# Patient Record
Sex: Male | Born: 1990 | Race: White | Hispanic: No | Marital: Single | Smoking: Current every day smoker
Health system: Southern US, Community
[De-identification: ages and names within clinical notes are randomized; demographics above are authoritative.]

## PROBLEM LIST (undated history)

## (undated) DIAGNOSIS — M109 Gout, unspecified: Secondary | ICD-10-CM

## (undated) HISTORY — PX: TOE SURGERY: SHX1073

---
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
## 2011-01-19 LAB — RAPID STREP SCREEN (MED CTR MEBANE ONLY): Streptococcus, Group A Screen (Direct): POSITIVE — AB

## 2011-10-09 ENCOUNTER — Encounter (HOSPITAL_BASED_OUTPATIENT_CLINIC_OR_DEPARTMENT_OTHER): Payer: Self-pay

## 2011-10-09 ENCOUNTER — Emergency Department (HOSPITAL_BASED_OUTPATIENT_CLINIC_OR_DEPARTMENT_OTHER): Payer: Self-pay

## 2011-10-09 ENCOUNTER — Emergency Department (HOSPITAL_BASED_OUTPATIENT_CLINIC_OR_DEPARTMENT_OTHER)
Admission: EM | Admit: 2011-10-09 | Discharge: 2011-10-09 | Disposition: A | Discharge status: Home / Self Care | Payer: Self-pay | Attending: Emergency Medicine | Admitting: Emergency Medicine

## 2011-10-09 DIAGNOSIS — W2209XA Striking against other stationary object, initial encounter: Secondary | ICD-10-CM | POA: Insufficient documentation

## 2011-10-09 DIAGNOSIS — S60221A Contusion of right hand, initial encounter: Secondary | ICD-10-CM

## 2011-10-09 DIAGNOSIS — S6990XA Unspecified injury of unspecified wrist, hand and finger(s), initial encounter: Secondary | ICD-10-CM | POA: Insufficient documentation

## 2011-10-09 MED ORDER — HYDROCODONE-ACETAMINOPHEN 5-325 MG PO TABS
1.0000 | ORAL_TABLET | Freq: Once | ORAL | Status: AC
  Administered 2011-10-09: 1 via ORAL
  Filled 2011-10-09: qty 1

## 2011-10-09 MED ORDER — HYDROCODONE-ACETAMINOPHEN 5-325 MG PO TABS: 1.0000 | ORAL_TABLET | Freq: Four times a day (QID) | ORAL | Status: AC | PRN

## 2011-10-09 NOTE — ED Notes (Signed)
Patient reports that he hit right hand yesterday on hood of car after hand slipped off wrench. Minimal swelling noted, no abrasion or bruising noted. Pain with movement of 4th and 5th digit

## 2011-10-09 NOTE — Discharge Instructions (Signed)
Contusion (Bruise) of Hand An injury to the hand may cause bruises (contusions). Contusions are caused by bleeding from small blood vessels (capillaries) that allow blood to leak out into the muscles, tendons, and surrounding soft tissue. This is followed by swelling and pain (inflammation). Contusions of the hand are common because of the use of hands in daily and recreational activities. Signs of a hand injury include pain, swelling, and a color change. Initially the skin may turn blue to purple in color. As the bruise ages, the color turns yellow and orange. Swelling may decrease the movement of the fingers. Contusions are seen more commonly with:  Contact sports (especially in football, wrestling, and basketball).   Use of medications that thin the blood (anticoagulants).   Use of aspirin and nonsteroidal anti-inflammatory agents that decrease the ability of the blood to clot.   Vitamin deficiencies.   Aging.  DIAGNOSIS  Diagnosis of hand injuries can be made by your own observation. If problems continue, a caregiver may be required for further evaluation and treatment. X-rays may be required to make sure there are no broken bones (fractures). Continued problems may require physical therapy for treatment. HOME CARE INSTRUCTIONS   Apply ice to the injury for 15 to 20 minutes, 3 to 4 times per day. Put the ice in a plastic bag and place a towel between the bag of ice and your skin.   An elastic bandage may be used initially for support and to minimize swelling. Do not wrap the hand too tightly. Do not sleep with the elastic bandage on.   Gentle massage from the fingertips towards the elbow will help keep the swelling down. Gently open and close your fist while doing this to maintain range of motion. Do this only after the first few days, when there is no or minimal pain.   Keep your hand above the level of the heart when swelling and pain are present. This will allow the fluid to drain out of  the hand, decreasing the amount of swelling. This will improve healing time.   Try to avoid use of the injured hand (except for gentle range of motion) while the hand is hurting. Do not resume use until instructed by your caregiver. Then begin use gradually, do not increase use to the point of pain. If pain does develop, decrease use and continue the above measures, gradually increasing activities that do not cause discomfort until you achieve normal use.   Only take over-the-counter or prescription medicines for pain, discomfort, or fever as directed by your caregiver.   Follow up with your caregiver as directed. Follow-up care may include orthopedic referrals, physical therapy, and rehabilitation. Any delay in obtaining necessary care could result in delayed healing, or temporary or permanent disability.  REHABILITATION  Begin daily rehabilitation exercises when an elastic bandage is no longer needed and you are either pain free or only have minimal pain.   Use ice massage for 10 minutes before and after workouts. Put ice in a plastic bag and place a towel between the bag of ice and your skin. Massage the injured area with the ice pack.  SEEK IMMEDIATE MEDICAL CARE IF:   Your pain and swelling increase, or pain is uncontrolled with medications.   You have loss of feeling in your hand, or your hand turns cold or blue.   An oral temperature above 102 F (38.9 C) develops, not controlled by medication.   Your hand becomes warm to the touch, or you  have increased pain with even slight movement of your fingers.   Your hand does not begin to improve in 1 or 2 days.   The skin is broken and signs of infection occur (fluid draining from the contusion, increasing pain, fever, headache, muscle aches, dizziness, or a general ill feeling).   You develop new, unexplained problems, or an increase of the symptoms that brought you to your caregiver.  MAKE SURE YOU:   Understand these instructions.     Will watch your condition.   Will get help right away if you are not doing well or get worse.  Document Released: 09/24/2001 Document Revised: 03/24/2011 Document Reviewed: 09/11/2009 [facility] Patient Information 2012 [place], [place].

## 2011-10-09 NOTE — ED Provider Notes (Signed)
History     CSN: [id number]  Arrival date & time 2011/10/12  0201   First MD Initiated Contact with Patient 10-12-11 0234      Chief Complaint  Patient presents with  . Hand Injury    (Consider location/radiation/quality/duration/timing/severity/associated sxs/prior treatment) HPI Is a 21 year old white male who was tightening a bolt with a wrench yesterday afternoon and his wrench slipped off the bolt. His right hand struck the hood of the car. He is now having pain and swelling to the dorsum of the right hand. The pain is moderate in severity and worse with palpation or movement of his fingers. He denies wrist pain. There is no deformity.  History reviewed. No pertinent past medical history.  History reviewed. No pertinent past surgical history.  No family history on file.  History  Substance Use Topics  . Smoking status: Not on file  . Smokeless tobacco: Not on file  . Alcohol Use: No      Review of Systems  All other systems reviewed and are negative.    Allergies  Review of patient's allergies indicates no known allergies.  Home Medications  No current outpatient prescriptions on file.  BP 136/69  Pulse 83  Temp 98.1 F (36.7 C) (Oral)  Resp 16  SpO2 100%  Physical Exam General: Well-developed, well-nourished male in no acute distress; appearance consistent with age of record HENT: normocephalic, atraumatic Eyes: Normal appearance Neck: supple Heart: regular rate and rhythm Lungs: Normal respiratory effort and excursion Abdomen: soft; nondistended Extremities: No deformity; decreased range of motion of fingers of right hand due to pain and swelling; mild swelling and tenderness of dorsal right hand without ecchymosis or crepitus, fingers distally neurovascularly intact with intact tendon function, no snuff box tenderness Neurologic: Awake, alert and oriented; motor function intact in all extremities and symmetric; no facial droop Skin: Warm and  dry Psychiatric: Normal mood and affect    ED Course  Procedures (including critical care time)     MDM   Nursing notes and vitals signs, including pulse oximetry, reviewed.  Summary of this visit's results, reviewed by myself:   Imaging Studies: Dg Hand Complete Right  12-Oct-2011  *RADIOLOGY REPORT*  Clinical Data: Injury to right hand; minimal swelling about the right hand, with pain on motion.  RIGHT HAND - COMPLETE 3+ VIEW  Comparison: Right middle finger radiographs performed 02/27/2006  Findings: There is no evidence of fracture or dislocation.  The joint spaces are preserved; mild soft tissue swelling is noted about the fifth metacarpophalangeal joint.  The carpal rows are intact, and demonstrate normal alignment.  Mild negative ulnar variance is noted.  IMPRESSION: No evidence of fracture or dislocation.  Original Report Authenticated By: Tonia Ghent, M.D.            Hanley Seamen, MD October 12, 2011 [phone]

## 2012-04-02 ENCOUNTER — Emergency Department (HOSPITAL_BASED_OUTPATIENT_CLINIC_OR_DEPARTMENT_OTHER)
Admission: EM | Admit: 2012-04-02 | Discharge: 2012-04-02 | Disposition: A | Discharge status: Home / Self Care | Payer: Self-pay | Attending: Emergency Medicine | Admitting: Emergency Medicine

## 2012-04-02 ENCOUNTER — Emergency Department (HOSPITAL_BASED_OUTPATIENT_CLINIC_OR_DEPARTMENT_OTHER): Payer: Self-pay

## 2012-04-02 ENCOUNTER — Encounter (HOSPITAL_BASED_OUTPATIENT_CLINIC_OR_DEPARTMENT_OTHER): Payer: Self-pay | Admitting: *Deleted

## 2012-04-02 DIAGNOSIS — S60221A Contusion of right hand, initial encounter: Secondary | ICD-10-CM

## 2012-04-02 DIAGNOSIS — Y929 Unspecified place or not applicable: Secondary | ICD-10-CM | POA: Insufficient documentation

## 2012-04-02 DIAGNOSIS — S60229A Contusion of unspecified hand, initial encounter: Secondary | ICD-10-CM | POA: Insufficient documentation

## 2012-04-02 DIAGNOSIS — Y939 Activity, unspecified: Secondary | ICD-10-CM | POA: Insufficient documentation

## 2012-04-02 DIAGNOSIS — F17200 Nicotine dependence, unspecified, uncomplicated: Secondary | ICD-10-CM | POA: Insufficient documentation

## 2012-04-02 DIAGNOSIS — X58XXXA Exposure to other specified factors, initial encounter: Secondary | ICD-10-CM | POA: Insufficient documentation

## 2012-04-02 MED ORDER — IBUPROFEN 800 MG PO TABS: 800.0000 mg | ORAL_TABLET | Freq: Three times a day (TID) | ORAL | Status: DC

## 2012-04-02 MED ORDER — IBUPROFEN 800 MG PO TABS
ORAL_TABLET | ORAL | Status: AC
  Administered 2012-04-02: 800 mg via ORAL
  Filled 2012-04-02: qty 1

## 2012-04-02 MED ORDER — IBUPROFEN 800 MG PO TABS
800.0000 mg | ORAL_TABLET | Freq: Once | ORAL | Status: AC
  Administered 2012-04-02: 800 mg via ORAL

## 2012-04-02 NOTE — ED Notes (Signed)
Pt c/o right hand injury x 4 days ago  

## 2012-04-02 NOTE — ED Notes (Signed)
rx x 1 for ibuprofen and ice pack given at d/c

## 2012-04-02 NOTE — ED Provider Notes (Signed)
History     CSN: [id number]  Arrival date & time 04/02/12  1950   First MD Initiated Contact with Patient 04/02/12 2159      Chief Complaint  Patient presents with  . Hand Injury    (Consider location/radiation/quality/duration/timing/severity/associated sxs/prior treatment) Patient is a 21 y.o. male presenting with hand injury. The history is provided by the patient. No language interpreter was used.  Hand Injury  Incident onset: 4 days ago. The injury mechanism was a direct blow. The pain is present in the right hand. The quality of the pain is described as aching. The pain is at a severity of 4/10. The pain is moderate. He reports no foreign bodies present. The symptoms are aggravated by movement. He has tried nothing for the symptoms.  Pt reports he has had a previous injury,  Pt reports he can not grip like he should.   Pt had an xray in June for injury,  Pt does not recall a previous fracture  History reviewed. No pertinent past medical history.  History reviewed. No pertinent past surgical history.  History reviewed. No pertinent family history.  History  Substance Use Topics  . Smoking status: Current Every Day Smoker    Types: Cigarettes  . Smokeless tobacco: Not on file  . Alcohol Use: No      Review of Systems  Musculoskeletal: Positive for joint swelling.  All other systems reviewed and are negative.    Allergies  Review of patient's allergies indicates no known allergies.  Home Medications  No current outpatient prescriptions on file.  BP 134/84  Pulse 83  Temp 98 F (36.7 C) (Oral)  Resp 16  Ht 6' (1.829 m)  Wt 170 lb (77.111 kg)  BMI 23.06 kg/m2  SpO2 100%  Physical Exam  Nursing note and vitals reviewed. Constitutional: He is oriented to person, place, and time. He appears well-developed and well-nourished.  HENT:  Head: Normocephalic and atraumatic.  Musculoskeletal: He exhibits tenderness.  Neurological: He is alert and oriented to  person, place, and time. He has normal reflexes.  Skin: Skin is warm.  Psychiatric: He has a normal mood and affect.    ED Course  Procedures (including critical care time)  Labs Reviewed - No data to display Dg Hand Complete Right  04/02/2012  *RADIOLOGY REPORT*  Clinical Data: Right hand and wrist pain following an injury.  RIGHT HAND - COMPLETE 3+ VIEW  Comparison: 10/09/2011.  Findings: A mild bowing deformity of the fifth metacarpal is unchanged.  No fractures or dislocations are seen.  IMPRESSION: No fracture or dislocation.   Original Report Authenticated By: Beckie Salts, M.D.      No diagnosis found.    MDM  I advised to follow up with Orthopaedist for evaluation        Elson Areas, [place] 04/02/12 2239

## 2012-04-04 NOTE — ED Provider Notes (Signed)
Medical screening examination/treatment/procedure(s) were performed by non-physician practitioner and as supervising physician I was immediately available for consultation/collaboration.   Gavin Pound. Oletta Lamas, MD 04/04/12 2107

## 2013-01-24 ENCOUNTER — Encounter (HOSPITAL_BASED_OUTPATIENT_CLINIC_OR_DEPARTMENT_OTHER): Payer: Self-pay | Admitting: Emergency Medicine

## 2013-01-24 ENCOUNTER — Emergency Department (HOSPITAL_BASED_OUTPATIENT_CLINIC_OR_DEPARTMENT_OTHER): Payer: 59

## 2013-01-24 ENCOUNTER — Emergency Department (HOSPITAL_BASED_OUTPATIENT_CLINIC_OR_DEPARTMENT_OTHER)
Admission: EM | Admit: 2013-01-24 | Discharge: 2013-01-24 | Disposition: A | Discharge status: Home / Self Care | Payer: 59 | Attending: Emergency Medicine | Admitting: Emergency Medicine

## 2013-01-24 DIAGNOSIS — Y939 Activity, unspecified: Secondary | ICD-10-CM | POA: Insufficient documentation

## 2013-01-24 DIAGNOSIS — M109 Gout, unspecified: Secondary | ICD-10-CM | POA: Insufficient documentation

## 2013-01-24 DIAGNOSIS — F17200 Nicotine dependence, unspecified, uncomplicated: Secondary | ICD-10-CM | POA: Insufficient documentation

## 2013-01-24 DIAGNOSIS — Y929 Unspecified place or not applicable: Secondary | ICD-10-CM | POA: Insufficient documentation

## 2013-01-24 DIAGNOSIS — W2203XA Walked into furniture, initial encounter: Secondary | ICD-10-CM | POA: Insufficient documentation

## 2013-01-24 MED ORDER — COLCHICINE 0.6 MG PO TABS: 0.6000 mg | ORAL_TABLET | Freq: Every day | ORAL | Status: DC

## 2013-01-24 MED ORDER — HYDROCODONE-ACETAMINOPHEN 5-325 MG PO TABS: 1.0000 | ORAL_TABLET | Freq: Four times a day (QID) | ORAL | Status: DC | PRN

## 2013-01-24 MED ORDER — COLCHICINE 0.6 MG PO TABS
1.2000 mg | ORAL_TABLET | Freq: Once | ORAL | Status: AC
  Administered 2013-01-24: 1.2 mg via ORAL
  Filled 2013-01-24: qty 2

## 2013-01-24 NOTE — ED Notes (Signed)
Right great toe injury. He hit it on a table about a week ago.

## 2013-01-24 NOTE — ED Provider Notes (Signed)
CSN: [id number]     Arrival date & time 01/24/13  1725 History  This chart was scribed for Shanna Cisco, MD by Danella Maiers, ED Scribe. This patient was seen in room MH06/MH06 and the patient's care was started at 5:43 PM.   Chief Complaint  Patient presents with  . Toe Injury   Patient is a 22 y.o. male presenting with toe pain. The history is provided by the patient. No language interpreter was used.  Toe Pain This is a new problem. The current episode started more than 1 week ago. The problem occurs constantly. The problem has been gradually worsening. Pertinent negatives include no chest pain, no abdominal pain, no headaches and no shortness of breath. Nothing relieves the symptoms.   HPI Comments: Ian Morrison is a 22 y.o. male who presents to the Emergency Department complaining of sudden-onset right great toe pain after stubbing it on a table 5 days ago. He states wearing a shoe makes the pain worse. He has been taking ibuprofen with no relief. He dropped a radiator on the same toe in the past and had to get stitches. He denies h/o gout. He has a family history (mother) of gout. He denies pain anywhere else in the foot. He denies fever, chills, nausea, emesis.  History reviewed. No pertinent past medical history. History reviewed. No pertinent past surgical history. No family history on file. History  Substance Use Topics  . Smoking status: Current Every Day Smoker    Types: Cigarettes  . Smokeless tobacco: Not on file  . Alcohol Use: No    Review of Systems  Constitutional: Negative for fever, activity change, appetite change and fatigue.  HENT: Negative for congestion, facial swelling, rhinorrhea and trouble swallowing.   Eyes: Negative for photophobia and pain.  Respiratory: Negative for cough, chest tightness and shortness of breath.   Cardiovascular: Negative for chest pain and leg swelling.  Gastrointestinal: Negative for nausea, vomiting, abdominal pain, diarrhea and  constipation.  Endocrine: Negative for polydipsia and polyuria.  Genitourinary: Negative for dysuria, urgency, decreased urine volume and difficulty urinating.  Musculoskeletal: Positive for arthralgias (left big toe). Negative for back pain and gait problem.  Skin: Negative for color change, rash and wound.  Allergic/Immunologic: Negative for immunocompromised state.  Neurological: Negative for dizziness, facial asymmetry, speech difficulty, weakness, numbness and headaches.  Psychiatric/Behavioral: Negative for confusion, decreased concentration and agitation.  All other systems reviewed and are negative.    Allergies  Review of patient's allergies indicates no known allergies.  Home Medications   Current Outpatient Rx  Name  Route  Sig  Dispense  Refill  . ibuprofen (ADVIL,MOTRIN) 800 MG tablet   Oral   Take 1 tablet (800 mg total) by mouth 3 (three) times daily.   21 tablet   0    BP 132/76  Pulse 86  Temp(Src) 98.3 F (36.8 C) (Oral)  Resp 16  Wt 170 lb (77.111 kg)  BMI 23.05 kg/m2  SpO2 100% Physical Exam  Constitutional: He is oriented to person, place, and time. He appears well-developed and well-nourished. No distress.  HENT:  Head: Normocephalic and atraumatic.  Mouth/Throat: No oropharyngeal exudate.  Eyes: Pupils are equal, round, and reactive to light.  Neck: Normal range of motion. Neck supple.  Cardiovascular: Normal rate, regular rhythm and normal heart sounds.  Exam reveals no gallop and no friction rub.   No murmur heard. Pulmonary/Chest: Effort normal and breath sounds normal. No respiratory distress. He has no wheezes. He has  no rales.  Abdominal: Soft. Bowel sounds are normal. He exhibits no distension and no mass. There is no tenderness. There is no rebound and no guarding.  Musculoskeletal: Normal range of motion. He exhibits no edema and no tenderness.       Left foot: He exhibits bony tenderness and swelling.       Feet:  Neurological: He is  alert and oriented to person, place, and time.  Skin: Skin is warm and dry.  Psychiatric: He has a normal mood and affect.    ED Course  Procedures (including critical care time) Medications - No data to display  DIAGNOSTIC STUDIES: Oxygen Saturation is 100% on RA, normal by my interpretation.    COORDINATION OF CARE: 6:18 PM- Discussed treatment plan with pt which includes treatment with antiinflammatories and Vicodin and pt agrees to plan.    Labs Review Labs Reviewed - No data to display Imaging Review Dg Toe Great Right  01/24/2013   CLINICAL DATA:  Great toe pain  EXAM: RIGHT GREAT TOE  COMPARISON:  Prior radiographs of the great toe 05/18/2009  FINDINGS: There is no evidence of fracture or dislocation. There is no evidence of arthropathy or other focal bone abnormality. Soft tissues are unremarkable.  IMPRESSION: Negative.   Electronically Signed   By: Malachy Moan M.D.   On: 01/24/2013 17:49    EKG Interpretation   None       MDM  No diagnosis found. Pt is a 22 y.o. male with Pmhx as above who presents with pain at R MTP joint of great toe after hitting it on furniture 1 week ago.  XR negative, joint very tender to touch, swollen.  I feel exam more c/w with gout than acute traumatic injury.  Will do trial of colchicine, pt can continue ibuprofen and given norco for breakthrough pain. Return precautions given for new or worsening symptoms including worsening pain, swelling, development of fever.   I personally performed the services described in this documentation, which was scribed in my presence. The recorded information has been reviewed and is accurate.     Shanna Cisco, MD 01/25/13 [phone]

## 2013-02-06 ENCOUNTER — Encounter (HOSPITAL_COMMUNITY): Payer: Self-pay | Admitting: Emergency Medicine

## 2013-02-06 ENCOUNTER — Emergency Department (HOSPITAL_COMMUNITY)
Admission: EM | Admit: 2013-02-06 | Discharge: 2013-02-06 | Disposition: A | Discharge status: Home / Self Care | Payer: 59 | Attending: Emergency Medicine | Admitting: Emergency Medicine

## 2013-02-06 DIAGNOSIS — M109 Gout, unspecified: Secondary | ICD-10-CM

## 2013-02-06 DIAGNOSIS — Z791 Long term (current) use of non-steroidal anti-inflammatories (NSAID): Secondary | ICD-10-CM | POA: Insufficient documentation

## 2013-02-06 DIAGNOSIS — F17200 Nicotine dependence, unspecified, uncomplicated: Secondary | ICD-10-CM | POA: Insufficient documentation

## 2013-02-06 DIAGNOSIS — M79674 Pain in right toe(s): Secondary | ICD-10-CM

## 2013-02-06 MED ORDER — HYDROCODONE-ACETAMINOPHEN 5-325 MG PO TABS: 1.0000 | ORAL_TABLET | Freq: Four times a day (QID) | ORAL | Status: DC | PRN

## 2013-02-06 MED ORDER — PREDNISONE 50 MG PO TABS: 50.0000 mg | ORAL_TABLET | Freq: Every day | ORAL | Status: DC

## 2013-02-06 NOTE — ED Provider Notes (Signed)
EKG Interpretation   None        Curren Mohrmann R Baylor Teegarden, MD 02/06/13 2340 

## 2013-02-06 NOTE — ED Notes (Signed)
Pt states that he hit his toe about three weeks ago and was seen and was told non fracture but prob gout. Pt states he stills has pain in right foot and now his right knee is painful and swollen.

## 2013-02-06 NOTE — ED Notes (Signed)
Pt in wheelchair to exam room. Pt states he is able to ambulate, but it is painful.

## 2013-02-06 NOTE — ED Provider Notes (Signed)
CSN: [id number]     Arrival date & time 02/06/13  1544 History   First MD Initiated Contact with Patient 02/06/13 1750     Chief Complaint  Patient presents with  . right foot pain   . right knee pain    HPI  Ian Morrison is a 22 yo male who complains of right big toe pain and right knee pain. He was seen about 2 weeks ago and was diagnosed with gout in his right toe. He had stubbed his toe and though this was the source of his pain. His xray showed no fractures of the toe. He was given Colchicine for 3 days but this did not resolve his toe pain. He now has right knee pain along with the toe pain. The knee pain occurs after he has been standing for awhile. He describes the pain as 'gnawing' and stabbing. He is unable to wear a shoe on his right foot, even his sock causes pain. His clothes do no bother his knee. He has tried ibuprofen at times but has not taken anything else for pain. No fever, chills, myalgia or other joint involvement. He is a current smoker and denies alcohol and drug use. No previous history of gout or joint injuries.   sHistory reviewed. No pertinent past medical history. History reviewed. No pertinent past surgical history. No family history on file. History  Substance Use Topics  . Smoking status: Current Every Day Smoker    Types: Cigarettes  . Smokeless tobacco: Not on file  . Alcohol Use: No    Review of Systems  Allergies  Review of patient's allergies indicates no known allergies.  Home Medications   Current Outpatient Rx  Name  Route  Sig  Dispense  Refill  . colchicine 0.6 MG tablet   Oral   Take 1 tablet (0.6 mg total) by mouth daily. If still having severe pain, may repeat in 3 days with 2 tablets (1.2mg ), followed by 1 tablet 1 hour later.   3 tablet   0   . HYDROcodone-acetaminophen (NORCO/VICODIN) 5-325 MG per tablet   Oral   Take 1 tablet by mouth every 6 (six) hours as needed for pain.   20 tablet   0   . ibuprofen (ADVIL,MOTRIN) 800 MG  tablet   Oral   Take 1 tablet (800 mg total) by mouth 3 (three) times daily.   21 tablet   0    BP 127/69  Pulse 73  Temp(Src) 97.9 F (36.6 C) (Oral)  Resp 18  SpO2 99% Physical Exam  Vitals reviewed. Constitutional: He is oriented to person, place, and time. He appears well-developed and well-nourished. No distress.  Musculoskeletal:       Right knee: He exhibits erythema. He exhibits normal range of motion, no swelling, normal alignment, no LCL laxity and no MCL laxity. Tenderness found.       Right ankle: He exhibits normal range of motion, no ecchymosis, no deformity and normal pulse. No tenderness.       Right lower leg: Normal. He exhibits no tenderness.       Feet:  Neurological: He is alert and oriented to person, place, and time.  Skin: Skin is warm and dry.  Psychiatric: He has a normal mood and affect.    ED Course  Procedures (including critical care time) The patient will be treated more effectively for gout flare. I did state that this could be some other issue. Told to return here as needed. Given  ortho follow up.    Carlyle Dolly, PA-C 02/06/13 [phone]

## 2013-02-23 ENCOUNTER — Emergency Department (HOSPITAL_BASED_OUTPATIENT_CLINIC_OR_DEPARTMENT_OTHER)
Admission: EM | Admit: 2013-02-23 | Discharge: 2013-02-24 | Disposition: A | Discharge status: Home / Self Care | Payer: 59 | Attending: Emergency Medicine | Admitting: Emergency Medicine

## 2013-02-23 ENCOUNTER — Encounter (HOSPITAL_BASED_OUTPATIENT_CLINIC_OR_DEPARTMENT_OTHER): Payer: Self-pay | Admitting: Emergency Medicine

## 2013-02-23 DIAGNOSIS — Z79899 Other long term (current) drug therapy: Secondary | ICD-10-CM | POA: Insufficient documentation

## 2013-02-23 DIAGNOSIS — Z791 Long term (current) use of non-steroidal anti-inflammatories (NSAID): Secondary | ICD-10-CM | POA: Insufficient documentation

## 2013-02-23 DIAGNOSIS — L600 Ingrowing nail: Secondary | ICD-10-CM | POA: Insufficient documentation

## 2013-02-23 DIAGNOSIS — M109 Gout, unspecified: Secondary | ICD-10-CM | POA: Insufficient documentation

## 2013-02-23 DIAGNOSIS — B353 Tinea pedis: Secondary | ICD-10-CM | POA: Insufficient documentation

## 2013-02-23 DIAGNOSIS — F17200 Nicotine dependence, unspecified, uncomplicated: Secondary | ICD-10-CM | POA: Insufficient documentation

## 2013-02-23 HISTORY — DX: Gout, unspecified: M10.9

## 2013-02-23 NOTE — ED Provider Notes (Signed)
CSN: [id number]     Arrival date & time 02/23/13  2245 History  This chart was scribed for Dione Booze, MD by Dorothey Baseman, ED Scribe. This patient was seen in room MH10/MH10 and the patient's care was started at 12:00 AM.    Chief Complaint  Patient presents with  . Gout  . Ingrown Toenail   The history is provided by the patient. No language interpreter was used.   HPI Comments: Ian Morrison is a 22 y.o. Male with a history of gout who presents to the Emergency Department complaining of an ingrown nail to the right great toe onset 2 weeks ago that he reports he unsuccessfully attempted to remove at home. He reports an associated, constant pain, 8/10 currently, to the area that is exacerbated with walking and bearing weight. Patient reports that he was seen here on 01/24/2013 and diagnosed with gout. Patient was discharged with prednisone, but he states he was unable to have it filled due to financial reasons. Patient was also discharged with colchicine and Vicodin, which he states has been effective at relieving his symptoms. He denies any other pertinent medical history. Patient is a current, every day smoker, 1 PPD.  Past Medical History  Diagnosis Date  . Gout    Past Surgical History  Procedure Laterality Date  . Toe surgery     No family history on file. History  Substance Use Topics  . Smoking status: Current Every Day Smoker    Types: Cigarettes  . Smokeless tobacco: Not on file  . Alcohol Use: No    Review of Systems  A complete 10 system review of systems was obtained and all systems are negative except as noted in the HPI and PMH.   Allergies  Review of patient's allergies indicates no known allergies.  Home Medications   Current Outpatient Rx  Name  Route  Sig  Dispense  Refill  . colchicine 0.6 MG tablet   Oral   Take 1 tablet (0.6 mg total) by mouth daily. If still having severe pain, may repeat in 3 days with 2 tablets (1.2mg ), followed by 1 tablet 1 hour  later.   3 tablet   0   . HYDROcodone-acetaminophen (NORCO/VICODIN) 5-325 MG per tablet   Oral   Take 1 tablet by mouth every 6 (six) hours as needed for pain.   20 tablet   0   . HYDROcodone-acetaminophen (NORCO/VICODIN) 5-325 MG per tablet   Oral   Take 1 tablet by mouth every 6 (six) hours as needed for pain.   15 tablet   0   . ibuprofen (ADVIL,MOTRIN) 800 MG tablet   Oral   Take 1 tablet (800 mg total) by mouth 3 (three) times daily.   21 tablet   0    Triage Vitals: BP 156/84  Temp(Src) 98.4 F (36.9 C) (Oral)  Ht 6' (1.829 m)  Wt 165 lb (74.844 kg)  BMI 22.37 kg/m2  SpO2 100%  Physical Exam  Nursing note and vitals reviewed. Constitutional: He is oriented to person, place, and time. He appears well-developed and well-nourished. No distress.  HENT:  Head: Normocephalic and atraumatic.  Eyes: Conjunctivae are normal.  Neck: Normal range of motion. Neck supple.  Pulmonary/Chest: Effort normal. No respiratory distress.  Abdominal: He exhibits no distension.  Musculoskeletal: Normal range of motion.  Right first toe there is an ingrown toenail medially with erythema, swelling, and tenderness.There is no erythema or warmth at the MTP joint. Scaly, erythematous rash present  on the lateral aspect of the foot. Maceration in the intertriginous area between the 3rd and 4th, 4th and 5th toes consistent with tinia pedis.  Neurological: He is alert and oriented to person, place, and time.  Skin: Skin is warm and dry.  Psychiatric: He has a normal mood and affect. His behavior is normal.    ED Course  Procedures (including critical care time)  DIAGNOSTIC STUDIES: Oxygen Saturation is 100% on room air, normal by my interpretation.    COORDINATION OF CARE: 12:05 PM- Will order an injection of Xylocaine and remove the toenail. Discussed treatment plan with patient at bedside and patient verbalized agreement.   After informed consent had been obtained, a timeout was  performed. Anesthesia was obtained with a digital block with 2% lidocaine. After adequate anesthesia had been obtained, the right first toenail was lifted from the nail bed and had trimmed back to allow adequate healing of the ingrown area. Patient tolerated procedure well.  MDM   1. Ingrowing right great toenail   2. Tinea pedis of right foot    Ingrown toenail of the right for this to. Tinea pedis. No evidence of acute gouty arthritis. Toenail need to be trimmed back to allow adequate healing, and the tinea pedis will need to be treated with over-the-counter antifungal agents.  I personally performed the services described in this documentation, which was scribed in my presence. The recorded information has been reviewed and is accurate.      Dione Booze, MD 02/24/13 [phone]

## 2013-02-23 NOTE — ED Notes (Signed)
C/o swelling, warmth, and redness to right great toe.  Reports history of gout.  Also c/o ingrown toenail to that toe which he tried to remove himself (without success).

## 2013-02-24 ENCOUNTER — Emergency Department (HOSPITAL_BASED_OUTPATIENT_CLINIC_OR_DEPARTMENT_OTHER)
Admission: EM | Admit: 2013-02-24 | Discharge: 2013-02-24 | Disposition: A | Discharge status: Home / Self Care | Payer: 59 | Attending: Emergency Medicine | Admitting: Emergency Medicine

## 2013-02-24 ENCOUNTER — Encounter (HOSPITAL_BASED_OUTPATIENT_CLINIC_OR_DEPARTMENT_OTHER): Payer: Self-pay | Admitting: Emergency Medicine

## 2013-02-24 DIAGNOSIS — F17200 Nicotine dependence, unspecified, uncomplicated: Secondary | ICD-10-CM | POA: Insufficient documentation

## 2013-02-24 DIAGNOSIS — B353 Tinea pedis: Secondary | ICD-10-CM | POA: Insufficient documentation

## 2013-02-24 DIAGNOSIS — L089 Local infection of the skin and subcutaneous tissue, unspecified: Secondary | ICD-10-CM | POA: Insufficient documentation

## 2013-02-24 DIAGNOSIS — L0889 Other specified local infections of the skin and subcutaneous tissue: Secondary | ICD-10-CM

## 2013-02-24 DIAGNOSIS — M109 Gout, unspecified: Secondary | ICD-10-CM | POA: Insufficient documentation

## 2013-02-24 MED ORDER — OXYCODONE-ACETAMINOPHEN 5-325 MG PO TABS
1.0000 | ORAL_TABLET | Freq: Once | ORAL | Status: AC
  Administered 2013-02-24: 1 via ORAL

## 2013-02-24 MED ORDER — PREDNISONE 50 MG PO TABS
60.0000 mg | ORAL_TABLET | Freq: Once | ORAL | Status: AC
  Administered 2013-02-24: 60 mg via ORAL
  Filled 2013-02-24 (×2): qty 1

## 2013-02-24 MED ORDER — OXYCODONE-ACETAMINOPHEN 5-325 MG PO TABS: 2.0000 | ORAL_TABLET | ORAL | Status: DC | PRN

## 2013-02-24 MED ORDER — OXYCODONE-ACETAMINOPHEN 5-325 MG PO TABS
2.0000 | ORAL_TABLET | Freq: Once | ORAL | Status: AC
  Administered 2013-02-24: 2 via ORAL
  Filled 2013-02-24: qty 2

## 2013-02-24 MED ORDER — PREDNISONE 10 MG PO TABS: 60.0000 mg | ORAL_TABLET | Freq: Every day | ORAL | Status: DC

## 2013-02-24 MED ORDER — CEPHALEXIN 500 MG PO CAPS: 500.0000 mg | ORAL_CAPSULE | Freq: Two times a day (BID) | ORAL | Status: DC

## 2013-02-24 MED ORDER — OXYCODONE-ACETAMINOPHEN 5-325 MG PO TABS
ORAL_TABLET | ORAL | Status: AC
  Filled 2013-02-24: qty 1

## 2013-02-24 MED ORDER — LIDOCAINE HCL 2 % IJ SOLN
10.0000 mL | Freq: Once | INTRAMUSCULAR | Status: AC
  Administered 2013-02-24: 200 mg
  Filled 2013-02-24: qty 100

## 2013-02-24 NOTE — ED Provider Notes (Signed)
CSN: [id number]     Arrival date & time 02/24/13  1139 History   First MD Initiated Contact with Patient 02/24/13 1205     Chief Complaint  Patient presents with  . Toe Pain   (Consider location/radiation/quality/duration/timing/severity/associated sxs/prior Treatment) Patient is a 22 y.o. male presenting with toe pain. The history is provided by the patient. No language interpreter was used.  Toe Pain This is a recurrent problem. The current episode started in the past 7 days. The problem occurs constantly. The problem has been unchanged. Pertinent negatives include no arthralgias, chills, fever, myalgias, nausea, numbness or vomiting. The treatment provided no relief.  Pt is a 22 year old male presenting with left great toe pain. He reports that he has had a recent history of problems with this toe. He was seen in October and given a trial of colchicine for possible gout of this left toe, he reports that he took the medication and did not get any resolution of symptoms. He returned last night to the ER and had an in-grown toe nail partially removed from his left great toe. He was encouraged to take Lotrimin cream or any anti-fungal cream OTC for a tinea infection of the 4th, 5th and 6th toes of the left foot. He also says that he injured this toe about 6 months ago when he dropped something on his foot. He denies fever, chills, vomiting or diarrhea. He denies any sick exposure and has been well except for localized problems with his left foot. He reports erythema, edema and is unable to ambulate due to the pain.    Past Medical History  Diagnosis Date  . Gout    Past Surgical History  Procedure Laterality Date  . Toe surgery     History reviewed. No pertinent family history. History  Substance Use Topics  . Smoking status: Current Every Day Smoker    Types: Cigarettes  . Smokeless tobacco: Not on file  . Alcohol Use: No    Review of Systems  Constitutional: Negative for fever and  chills.  Gastrointestinal: Negative for nausea, vomiting and diarrhea.  Musculoskeletal: Negative for arthralgias and myalgias.  Skin: Positive for wound.  Neurological: Negative for numbness.  All other systems reviewed and are negative.    Allergies  Review of patient's allergies indicates no known allergies.  Home Medications   Current Outpatient Rx  Name  Route  Sig  Dispense  Refill  . colchicine 0.6 MG tablet   Oral   Take 1 tablet (0.6 mg total) by mouth daily. If still having severe pain, may repeat in 3 days with 2 tablets (1.2mg ), followed by 1 tablet 1 hour later.   3 tablet   0   . ibuprofen (ADVIL,MOTRIN) 800 MG tablet   Oral   Take 1 tablet (800 mg total) by mouth 3 (three) times daily.   21 tablet   0    BP 137/83  Pulse 89  Temp(Src) 97.8 F (36.6 C) (Oral)  Resp 16  SpO2 100% Physical Exam  Nursing note and vitals reviewed. Constitutional: He is oriented to person, place, and time. He appears well-developed and well-nourished. No distress.  HENT:  Head: Normocephalic and atraumatic.  Mouth/Throat: Oropharynx is clear and moist.  Eyes: Pupils are equal, round, and reactive to light.  Neck: Normal range of motion. Neck supple.  Cardiovascular: Normal rate, regular rhythm and normal heart sounds.   Pulmonary/Chest: Effort normal and breath sounds normal. No respiratory distress. He has no wheezes.  Abdominal: Soft. Bowel sounds are normal. He exhibits no distension. There is no tenderness.  Musculoskeletal: He exhibits edema and tenderness.  Neurological: He is alert and oriented to person, place, and time.  Skin: Skin is warm and dry. Rash noted. There is erythema.     Psychiatric: He has a normal mood and affect. His behavior is normal. Judgment and thought content normal.    ED Course  Procedures (including critical care time) Labs Review Labs Reviewed - No data to display Imaging Review No results found.  EKG Interpretation   None         MDM   1. Tinea pedis   2. Secondary infection of skin     Pt has had ongoing issues with left great toe and 4th, 5th and 6th toes. Warm to touch with moderate erythema and edema. No relief from recent colchicine trial. Continue swelling and unable to ambulate. Lesion on 4th-5th digits consistent with tinea infection also may have secondary staph infection. Continue Lotrimin cream OTC, prednisone for the next three days- first dose given here, cephalexin for probable skin infection and percocet for break through pain. Recommended RICE and discussed care at length with pt. He understands and agrees to treatment plan. Sent home with crutches and urged to follow-up if fever, chills, streaking up his foot or leg.      Irish Elders, NP 02/24/13 1308

## 2013-02-24 NOTE — ED Provider Notes (Signed)
Medical screening examination/treatment/procedure(s) were performed by non-physician practitioner and as supervising physician I was immediately available for consultation/collaboration.  EKG Interpretation   None         Layla Maw Ward, DO 02/24/13 1539

## 2013-02-24 NOTE — ED Notes (Signed)
Pt was here for ingrown toenail yesterday, had it removed.  Now foot is swollen and painful, unable to bear weight.

## 2013-02-24 NOTE — ED Notes (Signed)
Suture Cart and lidocaine at bedside

## 2013-05-19 ENCOUNTER — Emergency Department (HOSPITAL_BASED_OUTPATIENT_CLINIC_OR_DEPARTMENT_OTHER)
Admission: EM | Admit: 2013-05-19 | Discharge: 2013-05-19 | Disposition: A | Discharge status: Home / Self Care | Payer: 59 | Attending: Emergency Medicine | Admitting: Emergency Medicine

## 2013-05-19 ENCOUNTER — Encounter (HOSPITAL_BASED_OUTPATIENT_CLINIC_OR_DEPARTMENT_OTHER): Payer: Self-pay | Admitting: Emergency Medicine

## 2013-05-19 DIAGNOSIS — F17200 Nicotine dependence, unspecified, uncomplicated: Secondary | ICD-10-CM | POA: Insufficient documentation

## 2013-05-19 DIAGNOSIS — Z791 Long term (current) use of non-steroidal anti-inflammatories (NSAID): Secondary | ICD-10-CM | POA: Insufficient documentation

## 2013-05-19 DIAGNOSIS — IMO0002 Reserved for concepts with insufficient information to code with codable children: Secondary | ICD-10-CM | POA: Insufficient documentation

## 2013-05-19 DIAGNOSIS — Z792 Long term (current) use of antibiotics: Secondary | ICD-10-CM | POA: Insufficient documentation

## 2013-05-19 DIAGNOSIS — M109 Gout, unspecified: Secondary | ICD-10-CM | POA: Insufficient documentation

## 2013-05-19 DIAGNOSIS — R000 Tachycardia, unspecified: Secondary | ICD-10-CM | POA: Insufficient documentation

## 2013-05-19 MED ORDER — OXYCODONE-ACETAMINOPHEN 5-325 MG PO TABS: 1.0000 | ORAL_TABLET | Freq: Four times a day (QID) | ORAL | Status: DC | PRN

## 2013-05-19 MED ORDER — PREDNISONE 10 MG PO TABS: 20.0000 mg | ORAL_TABLET | Freq: Every day | ORAL | Status: DC

## 2013-05-19 NOTE — Discharge Instructions (Signed)
Call Dr. Pearletha ForgeHudnall tomorrow for a follow up appointment. Return here as needed.

## 2013-05-19 NOTE — ED Provider Notes (Signed)
CSN: [id number]     Arrival date & time 05/19/13  1800 History   First MD Initiated Contact with Patient 05/19/13 1852     Chief Complaint  Patient presents with  . Foot Pain   (Consider location/radiation/quality/duration/timing/severity/associated sxs/prior Treatment) Patient is a 23 y.o. male presenting with lower extremity pain. The history is provided by the patient.  Foot Pain This is a recurrent problem. The current episode started today. The problem occurs constantly. The problem has been rapidly worsening. The symptoms are aggravated by standing and walking. He has tried nothing for the symptoms.   Ian Morrison is a 23 y.o. male who presents to the ED with pain in the right great toe. He has had several visits since October for problems with his toe. He was treated for ingrown toenail, skin infection, fungal infection and gout. He states he was fine when he went to bed last night and this morning woke with sever pain at the base of the right great toe. He denies any other symptoms.   Past Medical History  Diagnosis Date  . Gout    Past Surgical History  Procedure Laterality Date  . Toe surgery     No family history on file. History  Substance Use Topics  . Smoking status: Current Every Day Smoker    Types: Cigarettes  . Smokeless tobacco: Not on file  . Alcohol Use: No    Review of Systems Negative except as stated in HPI  Allergies  Review of patient's allergies indicates no known allergies.  Home Medications   Current Outpatient Rx  Name  Route  Sig  Dispense  Refill  . cephALEXin (KEFLEX) 500 MG capsule   Oral   Take 1 capsule (500 mg total) by mouth 2 (two) times daily.   20 capsule   0   . colchicine 0.6 MG tablet   Oral   Take 1 tablet (0.6 mg total) by mouth daily. If still having severe pain, may repeat in 3 days with 2 tablets (1.2mg ), followed by 1 tablet 1 hour later.   3 tablet   0   . ibuprofen (ADVIL,MOTRIN) 800 MG tablet   Oral   Take 1  tablet (800 mg total) by mouth 3 (three) times daily.   21 tablet   0   . oxyCODONE-acetaminophen (PERCOCET/ROXICET) 5-325 MG per tablet   Oral   Take 2 tablets by mouth every 4 (four) hours as needed for severe pain.   15 tablet   0   . predniSONE (DELTASONE) 10 MG tablet   Oral   Take 6 tablets (60 mg total) by mouth daily.   18 tablet   0    BP 140/66  Pulse 108  Temp(Src) 98 F (36.7 C) (Oral)  Resp 18  SpO2 100% Physical Exam  Nursing note and vitals reviewed. Constitutional: He is oriented to person, place, and time. He appears well-developed and well-nourished. No distress.  HENT:  Head: Atraumatic.  Eyes: EOM are normal.  Neck: Neck supple.  Cardiovascular: Tachycardia present.   Pulmonary/Chest: Effort normal.  Musculoskeletal: Normal range of motion.       Right foot: He exhibits tenderness and swelling. He exhibits normal capillary refill, no deformity and no laceration.       Feet:  Neurological: He is alert and oriented to person, place, and time. No cranial nerve deficit.  Skin: Skin is warm and dry.  Psychiatric: He has a normal mood and affect. His behavior is normal.  ED Course  Procedures   MDM  23 y.o. male with pain and inflammation of the right great toe. Will treat with prednisone and pain management. Stable for discharge and follow up with Dr. Pearletha ForgeHudnall. Discussed with the patient and all questioned fully answered.    Medication List    STOP taking these medications       cephALEXin 500 MG capsule  Commonly known as:  KEFLEX     colchicine 0.6 MG tablet     ibuprofen 800 MG tablet  Commonly known as:  ADVIL,MOTRIN      TAKE these medications       oxyCODONE-acetaminophen 5-325 MG per tablet  Commonly known as:  ROXICET  Take 1 tablet by mouth every 6 (six) hours as needed for severe pain.     predniSONE 10 MG tablet  Commonly known as:  DELTASONE  Take 2 tablets (20 mg total) by mouth daily.          NorthdaleHope M Carrson Light[place],  [place]NP 05/19/13 [phone]

## 2013-05-19 NOTE — ED Notes (Signed)
States that he woke up this morning and his right big toe was swollen and painful. Told recently that he has gout

## 2013-05-22 NOTE — ED Provider Notes (Signed)
Medical screening examination/treatment/procedure(s) were performed by non-physician practitioner and as supervising physician I was immediately available for consultation/collaboration.  EKG Interpretation   None         Blossom Crume, MD 05/22/13 0715 

## 2013-06-07 ENCOUNTER — Encounter (HOSPITAL_BASED_OUTPATIENT_CLINIC_OR_DEPARTMENT_OTHER): Payer: Self-pay | Admitting: Emergency Medicine

## 2013-06-07 ENCOUNTER — Emergency Department (HOSPITAL_BASED_OUTPATIENT_CLINIC_OR_DEPARTMENT_OTHER)
Admission: EM | Admit: 2013-06-07 | Discharge: 2013-06-07 | Disposition: A | Discharge status: Home / Self Care | Payer: 59 | Attending: Emergency Medicine | Admitting: Emergency Medicine

## 2013-06-07 DIAGNOSIS — F17200 Nicotine dependence, unspecified, uncomplicated: Secondary | ICD-10-CM | POA: Insufficient documentation

## 2013-06-07 DIAGNOSIS — M109 Gout, unspecified: Secondary | ICD-10-CM | POA: Insufficient documentation

## 2013-06-07 MED ORDER — HYDROCODONE-ACETAMINOPHEN 5-325 MG PO TABS
1.0000 | ORAL_TABLET | Freq: Once | ORAL | Status: AC
  Administered 2013-06-07: 1 via ORAL
  Filled 2013-06-07: qty 1

## 2013-06-07 MED ORDER — PREDNISONE 10 MG PO TABS: 20.0000 mg | ORAL_TABLET | Freq: Every day | ORAL | Status: DC

## 2013-06-07 MED ORDER — OXYCODONE-ACETAMINOPHEN 5-325 MG PO TABS: 1.0000 | ORAL_TABLET | Freq: Four times a day (QID) | ORAL | Status: DC | PRN

## 2013-06-07 MED ORDER — IBUPROFEN 800 MG PO TABS
800.0000 mg | ORAL_TABLET | Freq: Once | ORAL | Status: AC
  Administered 2013-06-07: 800 mg via ORAL
  Filled 2013-06-07: qty 1

## 2013-06-07 NOTE — Discharge Instructions (Signed)

## 2013-06-07 NOTE — ED Provider Notes (Signed)
CSN: [id number]     Arrival date & time 06/07/13  1448 History   First MD Initiated Contact with Patient 06/07/13 1459     Chief Complaint  Patient presents with  . Foot Pain     (Consider location/radiation/quality/duration/timing/severity/associated sxs/prior Treatment) HPI Comments: Patient presents with right great toe pain since last night. History of gout and similar pain. Denies any injury. Denies any fever or vomiting. Denies any focal weakness, numbness or tingling. Denies any pain in his other joints. Taking Tylenol without relief.  The history is provided by the patient.    Past Medical History  Diagnosis Date  . Gout    Past Surgical History  Procedure Laterality Date  . Toe surgery     History reviewed. No pertinent family history. History  Substance Use Topics  . Smoking status: Current Every Day Smoker    Types: Cigarettes  . Smokeless tobacco: Not on file  . Alcohol Use: No    Review of Systems  Constitutional: Negative for fever, activity change and appetite change.  Respiratory: Negative for cough, chest tightness and shortness of breath.   Cardiovascular: Negative for chest pain.  Gastrointestinal: Negative for nausea, vomiting and abdominal pain.  Genitourinary: Negative for dysuria and hematuria.  Musculoskeletal: Positive for arthralgias and myalgias. Negative for back pain.  Skin: Negative for rash.  Neurological: Negative for dizziness, weakness and headaches.  A complete 10 system review of systems was obtained and all systems are negative except as noted in the HPI and PMH.      Allergies  Review of patient's allergies indicates no known allergies.  Home Medications   Current Outpatient Rx  Name  Route  Sig  Dispense  Refill  . oxyCODONE-acetaminophen (ROXICET) 5-325 MG per tablet   Oral   Take 1 tablet by mouth every 6 (six) hours as needed for severe pain.   20 tablet   0   . predniSONE (DELTASONE) 10 MG tablet   Oral   Take 2  tablets (20 mg total) by mouth daily.   10 tablet   0    BP 135/65  Pulse 89  Temp(Src) 97.8 F (36.6 C) (Oral)  Resp 20  Ht 5\' 10"  (1.778 m)  Wt 165 lb (74.844 kg)  BMI 23.68 kg/m2  SpO2 100% Physical Exam  Constitutional: He is oriented to person, place, and time. He appears well-developed and well-nourished. No distress.  HENT:  Head: Normocephalic and atraumatic.  Mouth/Throat: Oropharynx is clear and moist. No oropharyngeal exudate.  Eyes: Conjunctivae are normal. Pupils are equal, round, and reactive to light.  Neck: Normal range of motion. Neck supple.  Cardiovascular: Normal rate, regular rhythm and normal heart sounds.   Pulmonary/Chest: Effort normal and breath sounds normal. No respiratory distress.  Abdominal: Soft. There is no tenderness. There is no rebound and no guarding.  Musculoskeletal: Normal range of motion. He exhibits tenderness. He exhibits no edema.  TTP R 1st MTP joint with erythema.  +2 DP and PT pulses  Neurological: He is alert and oriented to person, place, and time. No cranial nerve deficit. He exhibits normal muscle tone. Coordination normal.  Skin: Skin is warm.    ED Course  Procedures (including critical care time) Labs Review Labs Reviewed - No data to display Imaging Review No results found.  EKG Interpretation   None       MDM   Final diagnoses:  Gout of big toe    Right great toe pain similar to previous.exacerbations.  Neurovascular intact. Afebrile. Patient states colchicine has been ineffective in the past. Xray from October 2014 reviewed. No recent trauma.  We'll treat with anti-inflammatories and pain medication.    Glynn OctaveStephen Soha Thorup, MD 06/07/13 [phone]

## 2013-06-07 NOTE — ED Notes (Signed)
Onset of R foot/ R great toe pain last night.  Pt states that he believes it is gout, hx of same

## 2013-07-06 ENCOUNTER — Emergency Department (HOSPITAL_BASED_OUTPATIENT_CLINIC_OR_DEPARTMENT_OTHER)
Admission: EM | Admit: 2013-07-06 | Discharge: 2013-07-06 | Disposition: A | Discharge status: Home / Self Care | Payer: 59 | Attending: Emergency Medicine | Admitting: Emergency Medicine

## 2013-07-06 ENCOUNTER — Encounter (HOSPITAL_BASED_OUTPATIENT_CLINIC_OR_DEPARTMENT_OTHER): Payer: Self-pay | Admitting: Emergency Medicine

## 2013-07-06 DIAGNOSIS — M79671 Pain in right foot: Secondary | ICD-10-CM

## 2013-07-06 DIAGNOSIS — M79609 Pain in unspecified limb: Secondary | ICD-10-CM | POA: Insufficient documentation

## 2013-07-06 DIAGNOSIS — Z862 Personal history of diseases of the blood and blood-forming organs and certain disorders involving the immune mechanism: Secondary | ICD-10-CM | POA: Insufficient documentation

## 2013-07-06 DIAGNOSIS — IMO0002 Reserved for concepts with insufficient information to code with codable children: Secondary | ICD-10-CM | POA: Insufficient documentation

## 2013-07-06 DIAGNOSIS — Z8639 Personal history of other endocrine, nutritional and metabolic disease: Secondary | ICD-10-CM | POA: Insufficient documentation

## 2013-07-06 DIAGNOSIS — F17200 Nicotine dependence, unspecified, uncomplicated: Secondary | ICD-10-CM | POA: Insufficient documentation

## 2013-07-06 MED ORDER — IBUPROFEN 800 MG PO TABS: 800.0000 mg | ORAL_TABLET | Freq: Three times a day (TID) | ORAL | Status: DC

## 2013-07-06 MED ORDER — IBUPROFEN 800 MG PO TABS
800.0000 mg | ORAL_TABLET | Freq: Once | ORAL | Status: AC
  Administered 2013-07-06: 800 mg via ORAL
  Filled 2013-07-06: qty 1

## 2013-07-06 NOTE — ED Notes (Signed)
Patient declined his post op shoe and crutches.

## 2013-07-06 NOTE — ED Notes (Signed)
Right sided foot pain near joint of big toe.  No known injury.  No known fever.

## 2013-07-06 NOTE — ED Notes (Signed)
The patient refused the crutches and the post-op shoe.

## 2013-07-06 NOTE — Discharge Instructions (Signed)
Please ice and elevate right foot.  Use post op shoe and crutches.  Call Dr. Lazaro ArmsHudnall's office Monday for follow up this week.

## 2013-07-06 NOTE — ED Provider Notes (Signed)
CSN: [id number]     Arrival date & time 07/06/13  1532 History  This chart was scribed for Ian Quarryanielle S Minas Bonser, MD by Blanchard KelchNicole Curnes, ED Scribe. The patient was seen in room MH02/MH02. Patient's care was started at 5:01 PM.      Chief Complaint  Patient presents with  . Foot Pain     Patient is a 23 y.o. male presenting with lower extremity pain. The history is provided by the patient. No language interpreter was used.  Foot Pain This is a chronic problem. The current episode started more than 1 week ago. The problem occurs constantly. The problem has not changed since onset.Nothing relieves the symptoms. Treatments tried: Prednisone, Colchicine. The treatment provided no relief.    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 male with a history of gout who presents to the Emergency Department complaining of constant great right toe that began about eight months ago but worsened recently. He states the pain radiates to the sole of his foot. The pain is worsened by walking. He denies alleviating factors. He states that he was diagnosed with gout in that foot when he was 22 and has been seen in the ED seven times since then for the same pain. He denies that the Prednisone or Colchicine prescribed in the past have alleviated the pain. He believes that an aspiration of the toe has been done that showed gout and states x-rays have been done on the foot that came back normal. He has been unable to follow up with a specialist due to lack of insurance. He denies any other pertinent medical history.     Past Medical History  Diagnosis Date  . Gout    Past Surgical History  Procedure Laterality Date  . Toe surgery     No family history on file. History  Substance Use Topics  . Smoking status: Current Every Day Smoker    Types: Cigarettes  . Smokeless tobacco: Not on file  . Alcohol Use: No    Review of Systems  All other systems reviewed and are negative.      Allergies  Review of patient's  allergies indicates no known allergies.  Home Medications   Current Outpatient Rx  Name  Route  Sig  Dispense  Refill  . oxyCODONE-acetaminophen (ROXICET) 5-325 MG per tablet   Oral   Take 1 tablet by mouth every 6 (six) hours as needed for severe pain.   20 tablet   0   . predniSONE (DELTASONE) 10 MG tablet   Oral   Take 2 tablets (20 mg total) by mouth daily.   10 tablet   0    Triage Vitals: BP 122/73  Pulse 85  Temp(Src) 98.4 F (36.9 C) (Oral)  Resp 16  Ht 6' (1.829 m)  Wt 170 lb (77.111 kg)  BMI 23.05 kg/m2  SpO2 100%  Physical Exam  Nursing note and vitals reviewed. Constitutional: He is oriented to person, place, and time. He appears well-developed and well-nourished. No distress.  HENT:  Head: Normocephalic and atraumatic.  Eyes: EOM are normal.  Neck: Neck supple. No tracheal deviation present.  Cardiovascular: Normal rate.   Pulses:      Dorsalis pedis pulses are 2+ on the right side.       Posterior tibial pulses are 2+ on the right side.  Pulmonary/Chest: Effort normal. No respiratory distress.  Musculoskeletal: Normal range of motion. He exhibits tenderness. He exhibits no edema.  Tender to right first MTP  joint and dorsal aspect of right foot without any swelling or redness noted to either areas. No abnormality noted to any other aspect of right lower extremity.   Neurological: He is alert and oriented to person, place, and time.  Skin: Skin is warm and dry. No erythema.  Psychiatric: He has a normal mood and affect. His behavior is normal.    ED Course  Procedures (including critical care time)  DIAGNOSTIC STUDIES: Oxygen Saturation is 100% on room air, normal by my interpretation.    COORDINATION OF CARE: 5:07 PM -Will review old charts to look for definitive past diagnosis of gout. Will place in post op boot and refer to Orthopedics who will see patient without insurance. Patient verbalizes understanding and agrees with treatment  plan.    Labs Review Labs Reviewed - No data to display Imaging Review No results found.   EKG Interpretation None      MDM   Final diagnoses:  Right foot pain  I personally performed the services described in this documentation, which was scribed in my presence. The recorded information has been reviewed and considered.       Ian Quarry, MD 07/06/13 [phone]

## 2013-07-20 ENCOUNTER — Emergency Department (HOSPITAL_COMMUNITY)
Admission: EM | Admit: 2013-07-20 | Discharge: 2013-07-20 | Disposition: A | Discharge status: Home / Self Care | Payer: 59 | Attending: Emergency Medicine | Admitting: Emergency Medicine

## 2013-07-20 ENCOUNTER — Encounter (HOSPITAL_COMMUNITY): Payer: Self-pay | Admitting: Emergency Medicine

## 2013-07-20 DIAGNOSIS — M79673 Pain in unspecified foot: Secondary | ICD-10-CM

## 2013-07-20 DIAGNOSIS — F17200 Nicotine dependence, unspecified, uncomplicated: Secondary | ICD-10-CM | POA: Insufficient documentation

## 2013-07-20 DIAGNOSIS — IMO0002 Reserved for concepts with insufficient information to code with codable children: Secondary | ICD-10-CM | POA: Insufficient documentation

## 2013-07-20 DIAGNOSIS — M109 Gout, unspecified: Secondary | ICD-10-CM | POA: Insufficient documentation

## 2013-07-20 DIAGNOSIS — M25579 Pain in unspecified ankle and joints of unspecified foot: Secondary | ICD-10-CM | POA: Insufficient documentation

## 2013-07-20 DIAGNOSIS — Z9889 Other specified postprocedural states: Secondary | ICD-10-CM | POA: Insufficient documentation

## 2013-07-20 DIAGNOSIS — Z791 Long term (current) use of non-steroidal anti-inflammatories (NSAID): Secondary | ICD-10-CM | POA: Insufficient documentation

## 2013-07-20 MED ORDER — PREDNISONE 10 MG PO TABS
60.0000 mg | ORAL_TABLET | Freq: Once | ORAL | Status: AC
  Administered 2013-07-20: 60 mg via ORAL
  Filled 2013-07-20 (×2): qty 1

## 2013-07-20 MED ORDER — PREDNISONE 50 MG PO TABS: ORAL_TABLET | ORAL | Status: DC

## 2013-07-20 NOTE — ED Notes (Signed)
Pt c/o pain, swelling, and redness, to r great toe x 3 days.

## 2013-07-20 NOTE — ED Provider Notes (Signed)
CSN: [id number]     Arrival date & time 07/20/13  1721 History  This chart was scribed for Joya Gaskinsonald W Sakshi Sermons, MD by Bennett Scrapehristina Taylor, ED Scribe. This patient was seen in room APFT20/APFT20 and the patient's care was started at 5:47 PM.     Chief Complaint  Patient presents with  . Toe Pain     Patient is a 23 y.o. male presenting with toe pain. The history is provided by the patient. No language interpreter was used.  Toe Pain This is a recurrent problem. The current episode started more than 2 days ago. The problem occurs constantly. The problem has been gradually worsening. The symptoms are aggravated by walking. Nothing relieves the symptoms. He has tried nothing for the symptoms.    HPI Comments: Ian Morrison is a 23 y.o. male who presents to the Emergency Department complaining of 3 days of right great toe pain with associated redness and swelling. He denies any recent injuries. He reports that he has experienced prior episodes of the same dx as gout through blood work. He denies any other sxs currently.   No PCP currently.   Past Medical History  Diagnosis Date  . Gout    Past Surgical History  Procedure Laterality Date  . Toe surgery     No family history on file. History  Substance Use Topics  . Smoking status: Current Every Day Smoker    Types: Cigarettes  . Smokeless tobacco: Not on file  . Alcohol Use: No    Review of Systems  Musculoskeletal: Positive for joint swelling (roght great toe).  Skin: Positive for color change (redness to the right toe).    Allergies  Review of patient's allergies indicates no known allergies.  Home Medications   Current Outpatient Rx  Name  Route  Sig  Dispense  Refill  . ibuprofen (ADVIL,MOTRIN) 800 MG tablet   Oral   Take 1 tablet (800 mg total) by mouth 3 (three) times daily.   21 tablet   0   . oxyCODONE-acetaminophen (ROXICET) 5-325 MG per tablet   Oral   Take 1 tablet by mouth every 6 (six) hours as needed for severe  pain.   20 tablet   0   . predniSONE (DELTASONE) 10 MG tablet   Oral   Take 2 tablets (20 mg total) by mouth daily.   10 tablet   0    Triage Vitals: BP 135/65  Pulse 83  Temp(Src) 97.7 F (36.5 C) (Oral)  Resp 18  Ht 6' (1.829 m)  Wt 165 lb (74.844 kg)  BMI 22.37 kg/m2  SpO2 100%  Physical Exam  Nursing note and vitals reviewed.  CONSTITUTIONAL: Well developed/well nourished HEAD: Normocephalic/atraumatic ENMT: Mucous membranes moist NECK: supple no meningeal signs CV: S1/S2 noted, no murmurs/rubs/gallops noted LUNGS: Lungs are clear to auscultation bilaterally, no apparent distress NEURO: Pt is awake/alert, moves all extremitiesx4 EXTREMITIES: pulses normal, full ROM, tenderness to the right first MTP with mild erythema, no abscess or crepitus, pt can range the toe. No other abnormality noted.  No puncture wounds noted to right toe/foot SKIN: warm, color normal PSYCH: no abnormalities of mood noted  ED Course  Procedures   DIAGNOSTIC STUDIES: Oxygen Saturation is 100% on RA, normal by my interpretation.    COORDINATION OF CARE: 5:52 PM-Informed pt that sxs appear to be recurrent gout. Informed pt that he needs to [facility] non-emergent issues. Discussed discharge plan which includes prednsione with  pt and pt agreed to plan. Advised pt that he needs to take IBU for his pain. Pt reports he has used prednisone previously with great success.  He is not diabetic and he appears to safe to use this empirically Also, noted to have had multiple ER visits with multiple narcotics given in the ED, advised need for PCP   MDM   Final diagnoses:  Pain in foot    Nursing notes including past medical history and social history reviewed and considered in documentation Previous records reviewed and considered Narcotic database reviewed   I personally performed the services described in this documentation, which was scribed in my presence. The  recorded information has been reviewed and is accurate.       Joya Gaskins, MD 07/20/13 [phone]

## 2013-09-22 ENCOUNTER — Encounter (HOSPITAL_BASED_OUTPATIENT_CLINIC_OR_DEPARTMENT_OTHER): Payer: Self-pay | Admitting: Emergency Medicine

## 2013-09-22 ENCOUNTER — Emergency Department (HOSPITAL_BASED_OUTPATIENT_CLINIC_OR_DEPARTMENT_OTHER)
Admission: EM | Admit: 2013-09-22 | Discharge: 2013-09-22 | Disposition: A | Discharge status: Home / Self Care | Payer: 59 | Attending: Emergency Medicine | Admitting: Emergency Medicine

## 2013-09-22 DIAGNOSIS — Z9889 Other specified postprocedural states: Secondary | ICD-10-CM | POA: Insufficient documentation

## 2013-09-22 DIAGNOSIS — M10072 Idiopathic gout, left ankle and foot: Secondary | ICD-10-CM

## 2013-09-22 DIAGNOSIS — M109 Gout, unspecified: Secondary | ICD-10-CM | POA: Insufficient documentation

## 2013-09-22 DIAGNOSIS — F17200 Nicotine dependence, unspecified, uncomplicated: Secondary | ICD-10-CM | POA: Insufficient documentation

## 2013-09-22 MED ORDER — PREDNISONE 50 MG PO TABS: ORAL_TABLET | ORAL | Status: DC

## 2013-09-22 MED ORDER — HYDROCODONE-ACETAMINOPHEN 5-325 MG PO TABS: 2.0000 | ORAL_TABLET | ORAL | Status: DC | PRN

## 2013-09-22 NOTE — Discharge Instructions (Signed)

## 2013-09-22 NOTE — ED Provider Notes (Signed)
CSN: [id number]     Arrival date & time 09/22/13  2046 History   First MD Initiated Contact with Patient 09/22/13 2240     Chief Complaint  Patient presents with  . Right Foot Pain      (Consider location/radiation/quality/duration/timing/severity/associated sxs/prior Treatment) Patient is a 23 y.o. male presenting with lower extremity pain. The history is provided by the patient. No language interpreter was used.  Foot Pain This is a new problem. The current episode started today. The problem occurs constantly. The problem has been gradually worsening. Associated symptoms include joint swelling and myalgias. Nothing aggravates the symptoms. He has tried nothing for the symptoms. The treatment provided moderate relief.    Past Medical History  Diagnosis Date  . Gout    Past Surgical History  Procedure Laterality Date  . Toe surgery     No family history on file. History  Substance Use Topics  . Smoking status: Current Every Day Smoker    Types: Cigarettes  . Smokeless tobacco: Not on file  . Alcohol Use: No    Review of Systems  Musculoskeletal: Positive for joint swelling and myalgias.  All other systems reviewed and are negative.     Allergies  Review of patient's allergies indicates no known allergies.  Home Medications   Prior to Admission medications   Medication Sig Start Date End Date Taking? Authorizing Provider  predniSONE (DELTASONE) 50 MG tablet One tablet PO daily for 4 days 07/20/13   Joya Gaskins, MD   BP 127/64  Pulse 74  Temp(Src) 98.3 F (36.8 C) (Oral)  Resp 18  Ht 5\' 10"  (1.778 m)  Wt 165 lb (74.844 kg)  BMI 23.68 kg/m2  SpO2 100% Physical Exam  Constitutional: He is oriented to person, place, and time. He appears well-developed and well-nourished.  Musculoskeletal: He exhibits tenderness.  Swollen 1st metatarsal nv and ns intact  Neurological: He is alert and oriented to person, place, and time.  Skin: Skin is warm.  Psychiatric: He  has a normal mood and affect.    ED Course  Procedures (including critical care time) Labs Review Labs Reviewed - No data to display  Imaging Review No results found.   EKG Interpretation None      MDM   Final diagnoses:  Acute idiopathic gout of left foot    prednisone Hydrocodone     Elson Areas, PA-C 09/22/13 2318

## 2013-09-22 NOTE — ED Notes (Signed)
Right great toe pain since this morning.  Reports history of gout.

## 2013-09-24 NOTE — ED Provider Notes (Signed)
Medical screening examination/treatment/procedure(s) were performed by non-physician practitioner and as supervising physician I was immediately available for consultation/collaboration.   Magnolia Mattila David Jadalynn Burr III, MD 09/24/13 1606 

## 2013-10-05 ENCOUNTER — Encounter (HOSPITAL_BASED_OUTPATIENT_CLINIC_OR_DEPARTMENT_OTHER): Payer: Self-pay | Admitting: Emergency Medicine

## 2013-10-05 ENCOUNTER — Emergency Department (HOSPITAL_BASED_OUTPATIENT_CLINIC_OR_DEPARTMENT_OTHER)
Admission: EM | Admit: 2013-10-05 | Discharge: 2013-10-05 | Disposition: A | Discharge status: Home / Self Care | Payer: 59 | Attending: Emergency Medicine | Admitting: Emergency Medicine

## 2013-10-05 DIAGNOSIS — M109 Gout, unspecified: Secondary | ICD-10-CM | POA: Insufficient documentation

## 2013-10-05 DIAGNOSIS — IMO0002 Reserved for concepts with insufficient information to code with codable children: Secondary | ICD-10-CM | POA: Insufficient documentation

## 2013-10-05 DIAGNOSIS — Z79899 Other long term (current) drug therapy: Secondary | ICD-10-CM | POA: Insufficient documentation

## 2013-10-05 DIAGNOSIS — F17200 Nicotine dependence, unspecified, uncomplicated: Secondary | ICD-10-CM | POA: Insufficient documentation

## 2013-10-05 MED ORDER — INDOMETHACIN 25 MG PO CAPS: 25.0000 mg | ORAL_CAPSULE | Freq: Three times a day (TID) | ORAL | Status: DC | PRN

## 2013-10-05 MED ORDER — HYDROCODONE-ACETAMINOPHEN 5-325 MG PO TABS: 2.0000 | ORAL_TABLET | ORAL | Status: DC | PRN

## 2013-10-05 NOTE — ED Notes (Signed)
Pt reports continued foot pain and toe nail infections.

## 2013-10-05 NOTE — ED Provider Notes (Signed)
CSN: [id number]     Arrival date & time 10/05/13  1950 History   First MD Initiated Contact with Patient 10/05/13 2055     Chief Complaint  Patient presents with  . Foot Pain     (Consider location/radiation/quality/duration/timing/severity/associated sxs/prior Treatment) Patient is a 23 y.o. male presenting with lower extremity pain. The history is provided by the patient.  Foot Pain This is a recurrent problem. The current episode started in the past 7 days. The problem occurs constantly. The problem has been gradually worsening. Nothing aggravates the symptoms. He has tried nothing for the symptoms. The treatment provided no relief.  Pt complains of swelling to his toe and pain.   Past Medical History  Diagnosis Date  . Gout    Past Surgical History  Procedure Laterality Date  . Toe surgery     History reviewed. No pertinent family history. History  Substance Use Topics  . Smoking status: Current Every Day Smoker    Types: Cigarettes  . Smokeless tobacco: Not on file  . Alcohol Use: No    Review of Systems  All other systems reviewed and are negative.     Allergies  Review of patient's allergies indicates no known allergies.  Home Medications   Prior to Admission medications   Medication Sig Start Date End Date Taking? Authorizing Provider  HYDROcodone-acetaminophen (NORCO/VICODIN) 5-325 MG per tablet Take 2 tablets by mouth every 4 (four) hours as needed. 09/22/13   Elson AreasLeslie K Sofia, PA-C  HYDROcodone-acetaminophen (NORCO/VICODIN) 5-325 MG per tablet Take 2 tablets by mouth every 4 (four) hours as needed. 10/05/13   Elson AreasLeslie K Sofia, PA-C  indomethacin (INDOCIN) 25 MG capsule Take 1 capsule (25 mg total) by mouth 3 (three) times daily as needed. 10/05/13   Elson AreasLeslie K Sofia, PA-C  predniSONE (DELTASONE) 50 MG tablet One tablet PO daily for 4 days 07/20/13   Joya Gaskinsonald W Wickline, MD  predniSONE (DELTASONE) 50 MG tablet One tablet a day 09/22/13   Elson AreasLeslie K Sofia, PA-C   BP 150/73   Pulse 82  Temp(Src) 98.1 F (36.7 C) (Oral)  Resp 18  SpO2 99% Physical Exam  Vitals reviewed. Constitutional: He is oriented to person, place, and time. He appears well-developed.  Musculoskeletal: He exhibits tenderness.  Swollen right 1st toe  Neurological: He is alert and oriented to person, place, and time. He has normal reflexes.  Skin: Skin is warm.  Psychiatric: He has a normal mood and affect.    ED Course  Procedures (including critical care time) Labs Review Labs Reviewed - No data to display  Imaging Review No results found.   EKG Interpretation None      MDM   Final diagnoses:  Gout of big toe    Indocin hydrocodone    Elson AreasLeslie K Sofia, PA-C 10/05/13 2110

## 2013-10-05 NOTE — Discharge Instructions (Signed)

## 2013-10-05 NOTE — ED Notes (Signed)
Assumed care of patient from Waiting Room.

## 2013-10-05 NOTE — ED Provider Notes (Signed)
History/physical exam/procedure(s) were performed by non-physician practitioner and as supervising physician I was immediately available for consultation/collaboration. I have reviewed all notes and am in agreement with care and plan.   Hilario Quarryanielle S Ray, MD 10/05/13 [phone]

## 2013-10-08 ENCOUNTER — Encounter (HOSPITAL_BASED_OUTPATIENT_CLINIC_OR_DEPARTMENT_OTHER): Payer: Self-pay | Admitting: Emergency Medicine

## 2013-10-08 ENCOUNTER — Emergency Department (HOSPITAL_BASED_OUTPATIENT_CLINIC_OR_DEPARTMENT_OTHER)
Admission: EM | Admit: 2013-10-08 | Discharge: 2013-10-08 | Disposition: A | Discharge status: Home / Self Care | Payer: 59 | Attending: Emergency Medicine | Admitting: Emergency Medicine

## 2013-10-08 DIAGNOSIS — Z79899 Other long term (current) drug therapy: Secondary | ICD-10-CM | POA: Insufficient documentation

## 2013-10-08 DIAGNOSIS — M10071 Idiopathic gout, right ankle and foot: Secondary | ICD-10-CM

## 2013-10-08 DIAGNOSIS — M109 Gout, unspecified: Secondary | ICD-10-CM | POA: Insufficient documentation

## 2013-10-08 DIAGNOSIS — F17200 Nicotine dependence, unspecified, uncomplicated: Secondary | ICD-10-CM | POA: Insufficient documentation

## 2013-10-08 DIAGNOSIS — IMO0002 Reserved for concepts with insufficient information to code with codable children: Secondary | ICD-10-CM | POA: Insufficient documentation

## 2013-10-08 MED ORDER — HYDROCODONE-ACETAMINOPHEN 5-325 MG PO TABS: 2.0000 | ORAL_TABLET | ORAL | Status: DC | PRN

## 2013-10-08 MED ORDER — INDOMETHACIN 25 MG PO CAPS: 25.0000 mg | ORAL_CAPSULE | Freq: Three times a day (TID) | ORAL | Status: DC | PRN

## 2013-10-08 NOTE — Discharge Instructions (Signed)

## 2013-10-08 NOTE — ED Notes (Signed)
Right foot pain x 1 week. Developed redness and warmth to right great toe and dorsal aspect of foot.  Was seen in ED Saturday for dame, took all of Vicodin and continues to have pain.  Missed work today and needs a note.  Appt with Hud[facility]all scheduled for Friday.

## 2013-10-08 NOTE — ED Provider Notes (Signed)
CSN: [id number]     Arrival date & time 10/08/13  1116 History   First MD Initiated Contact with Patient 10/08/13 1142     Chief Complaint  Patient presents with  . Foot Pain     (Consider location/radiation/quality/duration/timing/severity/associated sxs/prior Treatment) HPI Comments: Patient with a history of gout presents with swelling and redness to his right big toe. He was seen recently and given a prescription for Vicodin and Indocin. He says he still having pain and he ran out of the Vicodin. He denies any worsening symptoms. He's had multiple flareups of gout in the same joint. He has an appointment to follow up with Dr. Pearletha ForgeHudnall on Friday. He describes constant throbbing pain. 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  Patient is a 23 y.o. male presenting with lower extremity pain.  Foot Pain Pertinent negatives include no headaches.    Past Medical History  Diagnosis Date  . Gout    Past Surgical History  Procedure Laterality Date  . Toe surgery     No family history on file. History  Substance Use Topics  . Smoking status: Current Every Day Smoker    Types: Cigarettes  . Smokeless tobacco: Not on file  . Alcohol Use: No    Review of Systems  Constitutional: Negative for fever.  Gastrointestinal: Negative for nausea and vomiting.  Musculoskeletal: Positive for arthralgias and joint swelling. Negative for back pain and neck pain.  Skin: Negative for wound.  Neurological: Negative for weakness, numbness and headaches.      Allergies  Review of patient's allergies indicates no known allergies.  Home Medications   Prior to Admission medications   Medication Sig Start Date End Date Taking? Authorizing Provider  HYDROcodone-acetaminophen (NORCO/VICODIN) 5-325 MG per tablet Take 2 tablets by mouth every 4 (four) hours as needed. 09/22/13   Elson AreasLeslie K Sofia, PA-C   HYDROcodone-acetaminophen (NORCO/VICODIN) 5-325 MG per tablet Take 2 tablets by mouth every 4 (four) hours as needed. 10/05/13   Elson AreasLeslie K Sofia, PA-C  HYDROcodone-acetaminophen (NORCO/VICODIN) 5-325 MG per tablet Take 2 tablets by mouth every 4 (four) hours as needed. 10/08/13   Rolan BuccoMelanie Thoams Siefert, MD  indomethacin (INDOCIN) 25 MG capsule Take 1 capsule (25 mg total) by mouth 3 (three) times daily as needed. 10/05/13   Elson AreasLeslie K Sofia, PA-C  indomethacin (INDOCIN) 25 MG capsule Take 1 capsule (25 mg total) by mouth 3 (three) times daily as needed. 10/08/13   Rolan BuccoMelanie Avonell Lenig, MD  predniSONE (DELTASONE) 50 MG tablet One tablet PO daily for 4 days 07/20/13   Joya Gaskinsonald W Wickline, MD  predniSONE (DELTASONE) 50 MG tablet One tablet a day 09/22/13   Elson AreasLeslie K Sofia, PA-C   BP 114/64  Pulse 75  Temp(Src) 98.9 F (37.2 C) (Oral)  Resp 20  Ht 6' (1.829 m)  Wt 165 lb (74.844 kg)  BMI 22.37 kg/m2  SpO2 99% Physical Exam  Constitutional: He is oriented to person, place, and time. He appears well-developed and well-nourished.  HENT:  Head: Normocephalic and atraumatic.  Neck: Normal range of motion. Neck supple.  Cardiovascular: Normal rate.   Pulmonary/Chest: Effort normal.  Musculoskeletal: He exhibits edema and tenderness.  Patient has mild swelling and redness to the first MTP joint of the right foot I don't feel a joint effusion. He has no wounds. He has no other bony tenderness to the foot. He has  normal pulses and sensation as well as motor function in the foot.  Neurological: He is alert and oriented to person, place, and time.  Skin: Skin is warm and dry.  Psychiatric: He has a normal mood and affect.    ED Course  Procedures (including critical care time) Labs Review Labs Reviewed - No data to display  Imaging Review No results found.   EKG Interpretation None      MDM   Final diagnoses:  Acute idiopathic gout of right foot    Patient is given a prescription for Indocin and Vicodin. His  symptoms were consistent with gout. I have a much lower suspicion for septic arthritis.    Rolan BuccoMelanie Andrez Lieurance, MD 10/08/13 1432

## 2013-10-10 ENCOUNTER — Ambulatory Visit: Payer: 59 | Admitting: Family Medicine

## 2013-10-13 ENCOUNTER — Emergency Department (HOSPITAL_BASED_OUTPATIENT_CLINIC_OR_DEPARTMENT_OTHER)
Admission: EM | Admit: 2013-10-13 | Discharge: 2013-10-13 | Discharge status: Against Medical Advice | Payer: 59 | Attending: Emergency Medicine | Admitting: Emergency Medicine

## 2013-10-13 ENCOUNTER — Encounter (HOSPITAL_BASED_OUTPATIENT_CLINIC_OR_DEPARTMENT_OTHER): Payer: Self-pay | Admitting: Emergency Medicine

## 2013-10-13 DIAGNOSIS — M79609 Pain in unspecified limb: Secondary | ICD-10-CM | POA: Insufficient documentation

## 2013-10-13 DIAGNOSIS — F17200 Nicotine dependence, unspecified, uncomplicated: Secondary | ICD-10-CM | POA: Insufficient documentation

## 2013-10-13 DIAGNOSIS — M79671 Pain in right foot: Secondary | ICD-10-CM

## 2013-10-13 MED ORDER — INDOMETHACIN 25 MG PO CAPS: 25.0000 mg | ORAL_CAPSULE | Freq: Three times a day (TID) | ORAL | Status: DC | PRN

## 2013-10-13 NOTE — ED Notes (Signed)
Patient states that his "foot just keeps swelling up".

## 2013-10-13 NOTE — ED Notes (Signed)
Patient refused to leave until PA talked with him again regarding a request for narcotic prescription. It was explained to the patient to keep his appointment on Thursday to see the orthopedic MD and take his prescribed medicine for gout & his state record was reviewed with him regarding numerous narcotic prescriptions. Patient walked out and refused to sign discharge papers. He was escorted to his vehicle by the police officer.

## 2013-10-13 NOTE — Discharge Instructions (Signed)

## 2013-10-14 ENCOUNTER — Ambulatory Visit: Payer: 59 | Admitting: Family Medicine

## 2013-11-06 NOTE — ED Provider Notes (Addendum)
CSN: [id number]     Arrival date & time 10/13/13  1637 History   First MD Initiated Contact with Patient 10/13/13 1715     Chief Complaint  Patient presents with  . Foot Pain     (Consider location/radiation/quality/duration/timing/severity/associated sxs/prior Treatment) HPI Comments: The patient returns to the ED for continued/recurrent right foot swelling and pain. He reports a history of gouty arthritis but has not had any outpatient follow up despite multiple ED visits and multiple referrals.  The history is provided by the patient. No language interpreter was used.    Past Medical History  Diagnosis Date  . Gout    Past Surgical History  Procedure Laterality Date  . Toe surgery     No family history on file. History  Substance Use Topics  . Smoking status: Current Every Day Smoker    Types: Cigarettes  . Smokeless tobacco: Not on file  . Alcohol Use: No    Review of Systems  Constitutional: Negative for fever and chills.  Musculoskeletal:       See HPI  Skin: Negative.  Negative for color change.  Neurological: Negative.  Negative for numbness.      Allergies  Review of patient's allergies indicates no known allergies.  Home Medications   Prior to Admission medications   Medication Sig Start Date End Date Taking? Authorizing Justise Ehmann  HYDROcodone-acetaminophen (NORCO/VICODIN) 5-325 MG per tablet Take 2 tablets by mouth every 4 (four) hours as needed. 09/22/13   Elson AreasLeslie K Sofia, PA-C  HYDROcodone-acetaminophen (NORCO/VICODIN) 5-325 MG per tablet Take 2 tablets by mouth every 4 (four) hours as needed. 10/05/13   Elson AreasLeslie K Sofia, PA-C  HYDROcodone-acetaminophen (NORCO/VICODIN) 5-325 MG per tablet Take 2 tablets by mouth every 4 (four) hours as needed. 10/08/13   Rolan BuccoMelanie Belfi, MD  indomethacin (INDOCIN) 25 MG capsule Take 1 capsule (25 mg total) by mouth 3 (three) times daily as needed. 10/08/13   Rolan BuccoMelanie Belfi, MD  indomethacin (INDOCIN) 25 MG capsule Take 1 capsule  (25 mg total) by mouth 3 (three) times daily as needed. 10/13/13   Arnoldo HookerShari A Upstill, PA-C  predniSONE (DELTASONE) 50 MG tablet One tablet PO daily for 4 days 07/20/13   Joya Gaskinsonald W Wickline, MD  predniSONE (DELTASONE) 50 MG tablet One tablet a day 09/22/13   Elson AreasLeslie K Sofia, PA-C   BP 125/71  Pulse 109  Temp(Src) 98.1 F (36.7 C) (Oral)  Resp 18  Ht 6' (1.829 m)  Wt 165 lb (74.844 kg)  BMI 22.37 kg/m2  SpO2 99% Physical Exam  Constitutional: He is oriented to person, place, and time. He appears well-developed and well-nourished.  Neck: Normal range of motion.  Pulmonary/Chest: Effort normal.  Musculoskeletal: Normal range of motion.  Right foot mildly swollen over 1st MTP. No wound, streaking or warmth.   Neurological: He is alert and oriented to person, place, and time.  Skin: Skin is warm and dry.  Psychiatric: He has a normal mood and affect.    ED Course  Procedures (including critical care time) Labs Review Labs Reviewed - No data to display  Imaging Review No results found.   EKG Interpretation None      MDM   Final diagnoses:  Right foot pain    Discussed his multiple ED visits for foot pain and encouraged outpatient follow up. No narcotic medication to be prescribed based on recurrent visits for pain complaints and multiple narcotic Rx's when data base reviewed. The patient became irate and refused to sign discharge  papers. He was escorted out of the ED by security.    Arnoldo Hooker, PA-C 11/06/13 1325  Arnoldo Hooker, PA-C 12/06/13 1452

## 2013-11-07 NOTE — ED Provider Notes (Signed)
Medical screening examination/treatment/procedure(s) were performed by non-physician practitioner and as supervising physician I was immediately available for consultation/collaboration.   Nelia Shiobert L Beaton, MD 11/07/13 [phone]

## 2013-12-08 NOTE — ED Provider Notes (Signed)
  Medical screening examination/treatment/procedure(s) were performed by non-physician practitioner and as supervising physician I was immediately available for consultation/collaboration.   Nelia Shi, MD 12/08/13 1120

## 2013-12-25 ENCOUNTER — Emergency Department (HOSPITAL_COMMUNITY)
Admission: EM | Admit: 2013-12-25 | Discharge: 2013-12-25 | Disposition: A | Discharge status: Home / Self Care | Payer: 59 | Attending: Emergency Medicine | Admitting: Emergency Medicine

## 2013-12-25 ENCOUNTER — Emergency Department (HOSPITAL_COMMUNITY): Payer: 59

## 2013-12-25 ENCOUNTER — Encounter (HOSPITAL_COMMUNITY): Payer: Self-pay | Admitting: Emergency Medicine

## 2013-12-25 DIAGNOSIS — F17200 Nicotine dependence, unspecified, uncomplicated: Secondary | ICD-10-CM | POA: Insufficient documentation

## 2013-12-25 DIAGNOSIS — L84 Corns and callosities: Secondary | ICD-10-CM | POA: Insufficient documentation

## 2013-12-25 DIAGNOSIS — Z791 Long term (current) use of non-steroidal anti-inflammatories (NSAID): Secondary | ICD-10-CM | POA: Insufficient documentation

## 2013-12-25 DIAGNOSIS — M109 Gout, unspecified: Secondary | ICD-10-CM

## 2013-12-25 DIAGNOSIS — IMO0002 Reserved for concepts with insufficient information to code with codable children: Secondary | ICD-10-CM | POA: Insufficient documentation

## 2013-12-25 MED ORDER — HYDROCODONE-ACETAMINOPHEN 5-325 MG PO TABS: 1.0000 | ORAL_TABLET | Freq: Four times a day (QID) | ORAL | Status: DC | PRN

## 2013-12-25 MED ORDER — NAPROXEN 500 MG PO TABS: 500.0000 mg | ORAL_TABLET | Freq: Two times a day (BID) | ORAL | Status: DC

## 2013-12-25 NOTE — ED Notes (Signed)
Per pt, states he has a history of gout-has not been on medication-right foot pain since this am

## 2013-12-25 NOTE — ED Provider Notes (Signed)
Medical screening examination/treatment/procedure(s) were performed by non-physician practitioner and as supervising physician I was immediately available for consultation/collaboration.   EKG Interpretation None        Ying Blankenhorn N Elyon Zoll, DO 12/25/13 2313 

## 2013-12-25 NOTE — Discharge Instructions (Signed)
Gout Gout is an inflammatory arthritis caused by a buildup of uric acid crystals in the joints. Uric acid is a chemical that is normally present in the blood. When the level of uric acid in the blood is too high it can form crystals that deposit in your joints and tissues. This causes joint redness, soreness, and swelling (inflammation). Repeat attacks are common. Over time, uric acid crystals can form into masses (tophi) near a joint, destroying bone and causing disfigurement. Gout is treatable and often preventable. CAUSES  The disease begins with elevated levels of uric acid in the blood. Uric acid is produced by your body when it breaks down a naturally found substance called purines. Certain foods you eat, such as meats and fish, contain high amounts of purines. Causes of an elevated uric acid level include:  Being passed down from parent to child (heredity).  Diseases that cause increased uric acid production (such as obesity, psoriasis, and certain cancers).  Excessive alcohol use.  Diet, especially diets rich in meat and seafood.  Medicines, including certain cancer-fighting medicines (chemotherapy), water pills (diuretics), and aspirin.  Chronic kidney disease. The kidneys are no longer able to remove uric acid well.  Problems with metabolism. Conditions strongly associated with gout include:  Obesity.  High blood pressure.  High cholesterol.  Diabetes. Not everyone with elevated uric acid levels gets gout. It is not understood why some people get gout and others do not. Surgery, joint injury, and eating too much of certain foods are some of the factors that can lead to gout attacks. SYMPTOMS   An attack of gout comes on quickly. It causes intense pain with redness, swelling, and warmth in a joint.  Fever can occur.  Often, only one joint is involved. Certain joints are more commonly involved:  Base of the big toe.  Knee.  Ankle.  Wrist.  Finger. Without  treatment, an attack usually goes away in a few days to weeks. Between attacks, you usually will not have symptoms, which is different from many other forms of arthritis. DIAGNOSIS  Your caregiver will suspect gout based on your symptoms and exam. In some cases, tests may be recommended. The tests may include:  Blood tests.  Urine tests.  X-rays.  Joint fluid exam. This exam requires a needle to remove fluid from the joint (arthrocentesis). Using a microscope, gout is confirmed when uric acid crystals are seen in the joint fluid. TREATMENT  There are two phases to gout treatment: treating the sudden onset (acute) attack and preventing attacks (prophylaxis).  Treatment of an Acute Attack.  Medicines are used. These include anti-inflammatory medicines or steroid medicines.  An injection of steroid medicine into the affected joint is sometimes necessary.  The painful joint is rested. Movement can worsen the arthritis.  You may use warm or cold treatments on painful joints, depending which works best for you.  Treatment to Prevent Attacks.  If you suffer from frequent gout attacks, your caregiver may advise preventive medicine. These medicines are started after the acute attack subsides. These medicines either help your kidneys eliminate uric acid from your body or decrease your uric acid production. You may need to stay on these medicines for a very long time.  The early phase of treatment with preventive medicine can be associated with an increase in acute gout attacks. For this reason, during the first few months of treatment, your caregiver may also advise you to take medicines usually used for acute gout treatment. Be sure you  understand your caregiver's directions. Your caregiver may make several adjustments to your medicine dose before these medicines are effective.  Discuss dietary treatment with your caregiver or dietitian. Alcohol and drinks high in sugar and fructose and foods  such as meat, poultry, and seafood can increase uric acid levels. Your caregiver or dietitian can advise you on drinks and foods that should be limited. HOME CARE INSTRUCTIONS   Do not take aspirin to relieve pain. This raises uric acid levels.  Only take over-the-counter or prescription medicines for pain, discomfort, or fever as directed by your caregiver.  Rest the joint as much as possible. When in bed, keep sheets and blankets off painful areas.  Keep the affected joint raised (elevated).  Apply warm or cold treatments to painful joints. Use of warm or cold treatments depends on which works best for you.  Use crutches if the painful joint is in your leg.  Drink enough fluids to keep your urine clear or pale yellow. This helps your body get rid of uric acid. Limit alcohol, sugary drinks, and fructose drinks.  Follow your dietary instructions. Pay careful attention to the amount of protein you eat. Your daily diet should emphasize fruits, vegetables, whole grains, and fat-free or low-fat milk products. Discuss the use of coffee, vitamin C, and cherries with your caregiver or dietitian. These may be helpful in lowering uric acid levels.  Maintain a healthy body weight. SEEK MEDICAL CARE IF:   You develop diarrhea, vomiting, or any side effects from medicines.  You do not feel better in 24 hours, or you are getting worse. SEEK IMMEDIATE MEDICAL CARE IF:   Your joint becomes suddenly more tender, and you have chills or a fever. MAKE SURE YOU:   Understand these instructions.  Will watch your condition.  Will get help right away if you are not doing well or get worse. Document Released: 04/01/2000 Document Revised: 08/19/2013 Document Reviewed: 11/16/2011 [facility] Patient Information 2015 [place], [place]. This information is not intended to replace advice given to you by your health care provider. Make sure you discuss any questions you have with your health care  provider.  Low-Purine Diet Purines are compounds that affect the level of uric acid in your body. A low-purine diet is a diet that is low in purines. Eating a low-purine diet can prevent the level of uric acid in your body from getting too high and causing gout or kidney stones or both. WHAT DO I NEED TO KNOW ABOUT THIS DIET?  Choose low-purine foods. Examples of low-purine foods are listed in the next section.  Drink plenty of fluids, especially water. Fluids can help remove uric acid from your body. Try to drink 8-16 cups (1.9-3.8 L) a day.  Limit foods high in fat, especially saturated fat, as fat makes it harder for the body to get rid of uric acid. Foods high in saturated fat include pizza, cheese, ice cream, whole milk, fried foods, and gravies. Choose foods that are lower in fat and lean sources of protein. Use olive oil when cooking as it contains healthy fats that are not high in saturated fat.  Limit alcohol. Alcohol interferes with the elimination of uric acid from your body. If you are having a gout attack, avoid all alcohol.  Keep in mind that different people's bodies react differently to different foods. You will probably learn over time which foods do or do not affect you. If you discover that a food tends to cause your gout to flare  up, avoid eating that food. You can more freely enjoy foods that do not cause problems. If you have any questions about a food item, talk to your dietitian or health care provider. WHICH FOODS ARE LOW, MODERATE, AND HIGH IN PURINES? The following is a list of foods that are low, moderate, and high in purines. You can eat any amount of the foods that are low in purines. You may be able to have small amounts of foods that are moderate in purines. Ask your health care provider how much of a food moderate in purines you can have. Avoid foods high in purines. Grains  Foods low in purines: Enriched white bread, pasta, rice, cake, cornbread, popcorn.  Foods  moderate in purines: Whole-grain breads and cereals, wheat germ, bran, oatmeal. Uncooked oatmeal. Dry wheat bran or wheat germ.  Foods high in purines: Pancakes, [place] toast, biscuits, muffins. Vegetables  Foods low in purines: All vegetables, except those that are moderate in purines.  Foods moderate in purines: Asparagus, cauliflower, spinach, mushrooms, green peas. Fruits  All fruits are low in purines. Meats and other Protein Foods  Foods low in purines: Eggs, nuts, peanut butter.  Foods moderate in purines: 80-90% lean beef, lamb, veal, pork, poultry, fish, eggs, peanut butter, nuts. Crab, lobster, oysters, and shrimp. Cooked dried beans, peas, and lentils.  Foods high in purines: Anchovies, sardines, herring, mussels, tuna, codfish, scallops, trout, and haddock. Ian Morrison. Organ meats (such as liver or kidney). Tripe. Game meat. Goose. Sweetbreads. Dairy  All dairy foods are low in purines. Low-fat and fat-free dairy products are best because they are low in saturated fat. Beverages  Drinks low in purines: Water, carbonated beverages, tea, coffee, cocoa.  Drinks moderate in purines: Soft drinks and other drinks sweetened with high-fructose corn syrup. Juices. To find whether a food or drink is sweetened with high-fructose corn syrup, look at the ingredients list.  Drinks high in purines: Alcoholic beverages (such as beer). Condiments  Foods low in purines: Salt, herbs, olives, pickles, relishes, vinegar.  Foods moderate in purines: Butter, margarine, oils, mayonnaise. Fats and Oils  Foods low in purines: All types, except gravies and sauces made with meat.  Foods high in purines: Gravies and sauces made with meat. Other Foods  Foods low in purines: Sugars, sweets, gelatin. Cake. Soups made without meat.  Foods moderate in purines: Meat-based or fish-based soups, broths, or bouillons. Foods and drinks sweetened with high-fructose corn syrup.  Foods high in purines:  High-fat desserts (such as ice cream, cookies, cakes, pies, doughnuts, and chocolate). Contact your dietitian for more information on foods that are not listed here. Document Released: 07/30/2010 Document Revised: 04/09/2013 Document Reviewed: 03/11/2013 [facility] Patient Information 2015 [place], [place]. This information is not intended to replace advice given to you by your health care provider. Make sure you discuss any questions you have with your health care provider.   Emergency Department Resource Guide 1) Find a Doctor and Pay Out of Pocket Although you won't have to find out who is covered by your insurance plan, it is a good idea to ask around and get recommendations. You will then need to call the office and see if the doctor you have chosen will accept you as a new patient and what types of options they offer for patients who are self-pay. Some doctors offer discounts or will set up payment plans for their patients who do not have insurance, but you will need to ask so you aren't surprised when you get  to your appointment.  2) Contact Your Local Health Department Not all health departments have doctors that can see patients for sick visits, but many do, so it is worth a call to see if yours does. If you don't know where your local health department is, you can check in your phone book. The CDC also has a tool to help you locate your state's health department, and many state websites also have listings of all of their local health departments.  3) Find a Walk-in Clinic If your illness is not likely to be very severe or complicated, you may want to try a walk in clinic. These are popping up all over the country in pharmacies, drugstores, and shopping centers. They're usually staffed by nurse practitioners or physician assistants that have been trained to treat common illnesses and complaints. They're usually fairly quick and inexpensive. However, if you have serious medical issues or chronic  medical problems, these are probably not your best option.  No Primary Care Doctor: - Call Health Connect at  [phone] - they can help you locate a primary care doctor that  accepts your insurance, provides certain services, etc. - Physician Referral Service- [phone]  Chronic Pain Problems: Organization         Address  Phone   Notes  [facility]  [phone] Patients need to be referred by their primary care doctor.   Medication Assistance: Organization         Address  Phone   Notes  [facility] Medication [facility] [address] [place], [place] [address] [phone] --Must be a resident of [facility] -- Must have NO insurance coverage whatsoever (no Medicaid/ Medicare, etc.) -- The pt. MUST have a primary care doctor that directs their care regularly and follows them in the community   [facility]ist  [phone]   [employer]  [phone]    Agencies that provide inexpensive medical care: Organization         Address  Phone   Notes  [facility]  [phone]   [facility]    [phone]   [facility] [address] [place], [place] [address] [phone]   [facility] [address] [place] [place] [phone]   Planned Parenthood    [phone]   [facility]    [phone]   Community Health and [facility]  [address], [place] Phone:  [phone], Fax:  [phone] Hours of Operation:  9 am - 6 pm, M-F.  Also accepts Medicaid/Medicare and self-pay.  [facility]  [address], [place] Phone: [phone], Fax: [phone]. Hours of Operation:  8:30 am - 5:30 pm, M-F.  Also accepts Medicaid and self-pay.  [facility] [place] [address] [place] Phone: [phone]   [facility] [address] Natasha Bence [place], [place] [phone],  Ext. [phone] Mondays & Thursdays: 7-9 AM.  First 15 patients are seen on a first come, first serve basis.    Medicaid-accepting [facility] Providers:  Organization         Address  Phone   Notes  [facility] [address], [place] [phone] Also accepts self-pay patients.  [facility] [address] Laurell Josephs [place], [place]  [phone]   [facility] [phone]  [address], [place] [phone]) [phone]   [facility] [address] [place] [address], [place] [phone]) [phone]   Renaye RakersVeita Bland [address], [place]   [phone] Only accepts [place] Access [place] patients after they have their name applied to their card.   Self-Pay (no insurance) in [place]:  Organization         Address  Phone   Notes  Sickle Cell Patients, [facility] [address], [place] [phone]   [facility] [address], [place] [phone]   [facility] [place]  [address], [place] [phone]) [phone]   [facility]/Dr. Osei-Bonsu  [address] [place] [address], [place] or [phone], [place] [phone] Phone number for both [place] and [place] locations is the same.  [facility] and [facility] [address], [place] [phone]   [facility] [place] [address] [place] [address], [place] or [address] [phone]) [phone]) [phone]   [facility] [address], [place] [phone], phone; [phone](336) [phone], fax Sees patients 1st and 3rd Saturday of every month.  Must not qualify for public or private insurance (i.e. Medicaid, Medicare, [place] Health Choice, Veterans' Benefits)  Household income should be no more than 200% of the poverty level The clinic cannot treat you if you are pregnant or think you are pregnant  Sexually transmitted diseases are not  treated at the clinic.    Dental Care: Organization         Address  Phone  Notes  [facility] [address], [place] [phone]) [phone] Accepts children up to age 22 who are enrolled in [place] or [place] Health Choice; pregnant women with a Medicaid card; and children who have applied for Medicaid or [place] Health Choice, but were declined, whose parents can pay a reduced fee at time of service.  [facility] [place]  [address], [place] [phone] Accepts children up to age 22 who are enrolled in [place] or [place] Health Choice; pregnant women with a Medicaid card; and children who have applied for Medicaid or [place] Health Choice, but were declined, whose parents can pay a reduced fee at time of service.  [facility]  [address], [place] [phone]) [phone] Patients are seen by appointment only. Walk-ins are not accepted. [facility] will see patients 22 years of age and older. Monday - Tuesday (8am-5pm) Most Wednesdays (8:30-5pm) $30 per visit, cash only  [facility]  [address], [facility] [phone]) [phone] Patients are seen by appointment only. Walk-ins are not accepted. [facility] will see patients 22 years of age and older. One Wednesday Evening (Monthly: Volunteer Based).  $30 per visit, cash only  [employer]  [phone] for adults; Children under age 64, call Graduate Pediatric Dentistry at [phone]) [phone]. Children aged 524-14, please call [phone] to request a pediatric application.  Dental services are provided in all areas of dental care including fillings, crowns and bridges, complete and partial dentures, implants, gum treatment, root canals, and extractions. Preventive care is also provided. Treatment is provided to both adults and children. Patients are selected via a lottery and there is  often a waiting list.   [facility]  [address], Ginette Otto  [phone] [email or web address]   [facility] [address] [place], [place] [phone], Ext. [phone] Second and Fourth Thursday of each month, opens at 6:30 AM; Clinic ends at 9 AM.  Patients are seen on a first-come first-served basis, and a limited number are seen during each clinic.   [facility]  [address] Ether Griffins [place], [place] [phone]   Eligibility Requirements You must have lived in [place], [place], or [place] counties for at least the last three months.   You cannot be eligible for state or federal sponsored [place], including [employer], [place], or [employer].   You generally cannot be eligible for healthcare insurance through your employer.    How to apply: Eligibility screenings are held every Tuesday and Wednesday afternoon from 1:00 pm until 4:00 pm. You do not need an appointment for the interview!  [facility] [address], [place], [place] [phone]   [facility] Health Department  [phone]   [facility]  [phone]   [facility] Health Department  [phone]    Behavioral Health Resources in the Community: Intensive Outpatient Programs Organization         Address  Phone  Notes  [facility] Services [address], [place], [place] [phone]   [facility] Outpatient [address], [place], [place] [phone]   [facility] [address], [place], [place]  [phone]   [facility] [address]  [place], [place] [phone] or [phone]   Substance Abuse Resources Organization         Address  Phone  Notes  [facility] Drug Services  [phone]   [facility]  [phone]   [facility] [place]  [phone]   Floydene Flock  [phone]   Residential & Outpatient Substance  Abuse Program  [phone]   Psychological Services Organization         Address  Phone  Notes  [facility] Behavioral Health  [phone]   [facility] Services  [phone]   [facility] [address] [place] [phone] or [phone]    Mobile Crisis Teams Organization         Address  Phone  Notes  [facility], Mobile Crisis Care Unit  [phone]   [facility]  [address] [place], [place] [phone]   Doristine Locks [address] [place] [phone]    Self-Help/Support Groups Organization         Address  Phone             Notes  [facility]ental Health [facility]oc[facility] of [place] - variety of support groups  [phone] Call for more information  Narcotics Anonymous (NA), [facility] [address], [employer] [place]  2 meetings at this location   Statistician         Address  Phone  Notes  [facility] [address],    [place]  [phone]   [facility]  [address], [place] [address], [place], [place] [phone]   [facility] [address] [place], [place] [phone] Admissions: 8am-3pm M-F  [facility] [address]    [place], [place] [phone]   [facility] [address] Starling Manns [place], [place] [phone]   [facility] [address],  [place], [place]  [facility] Intensive Outpatient [address], [place], [place]   [facility] [address]  [place], [place] [phone] or [phone]   [facility] ([facility]) [address], [place], [place] Accepts Medicaid  [facility] [place] [address]  [place] [phone] Substance Abuse/Addiction Treatment   [facility] Organization          Address  Phone  Notes  [facility]  [phone]   Domenic Schwab, PhD [address] Arlis Porta [place], [place]   [phone] or [phone]   Zena Lowry [address] [place], [place] [phone]   [address], [place], [place] [phone] Insurance/Medicaid/sponsorship through [facility] and Families [address] [place]                                    [place], [place] [phone] [place] [address], [place] [phone]    Dr. Adele Schilder  [phone]   [facility] Dept. 1) [address], [place] 2) [place] 3)  [place] [address], [place] [phone]  [phone]   [place] [phone] or [phone] (After Hours)

## 2013-12-25 NOTE — ED Provider Notes (Signed)
CSN: [id number]     Arrival date & time 12/25/13  1635 History  This chart was scribed for non-physician practitioner, Terri Piedra, PA-C,working with Layla Maw Ward, DO, by Karle Plumber, ED Scribe. This patient was seen in room WTR9/WTR9 and the patient's care was started at 5:38 PM.  Chief Complaint  Patient presents with  . Gout   The history is provided by the patient. No language interpreter was used.   HPI Comments:  Ian Morrison is a 23 y.o. male with PMH of gout who presents to the Emergency Department complaining of severe right foot pain that started yesterday. Pt reports an area at the base of his toe on the plantar aspect hurts to touch. He states this feels like his previous gout exacerbations but states the nodule on the bottom of his foot is different. He states he cannot wear socks due to the pain and reports associated tingling, warmth and redness of the toe. Pt states he took Ibuprofen for the pain with only minimal relief. He denies numbness, fever or vomiting. He denies wearing any new shoes recently or denies any trauma to the foot. Pt had an appt to be seen by a PCP in the past for this issue but states they resolved so he did not go. He reports he is a smoker and uses alcohol occasionally. He states he has a poor diet and eats fast food regularly.  Past Medical History  Diagnosis Date  . Gout    Past Surgical History  Procedure Laterality Date  . Toe surgery     No family history on file. History  Substance Use Topics  . Smoking status: Current Every Day Smoker    Types: Cigarettes  . Smokeless tobacco: Not on file  . Alcohol Use: No    Review of Systems  Musculoskeletal: Positive for arthralgias.  All other systems reviewed and are negative.   Allergies  Review of patient's allergies indicates no known allergies.  Home Medications   Prior to Admission medications   Medication Sig Start Date End Date Taking? Authorizing Provider   HYDROcodone-acetaminophen (NORCO) 5-325 MG per tablet Take 1 tablet by mouth every 6 (six) hours as needed for moderate pain. 12/25/13   Raquon Milledge A Forcucci, PA-C  HYDROcodone-acetaminophen (NORCO/VICODIN) 5-325 MG per tablet Take 2 tablets by mouth every 4 (four) hours as needed. 09/22/13   Elson Areas, PA-C  HYDROcodone-acetaminophen (NORCO/VICODIN) 5-325 MG per tablet Take 2 tablets by mouth every 4 (four) hours as needed. 10/05/13   Elson Areas, PA-C  HYDROcodone-acetaminophen (NORCO/VICODIN) 5-325 MG per tablet Take 2 tablets by mouth every 4 (four) hours as needed. 10/08/13   Rolan Bucco, MD  indomethacin (INDOCIN) 25 MG capsule Take 1 capsule (25 mg total) by mouth 3 (three) times daily as needed. 10/08/13   Rolan Bucco, MD  indomethacin (INDOCIN) 25 MG capsule Take 1 capsule (25 mg total) by mouth 3 (three) times daily as needed. 10/13/13   Shari A Upstill, PA-C  naproxen (NAPROSYN) 500 MG tablet Take 1 tablet (500 mg total) by mouth 2 (two) times daily. 12/25/13   Pollyanna Levay A Forcucci, PA-C  predniSONE (DELTASONE) 50 MG tablet One tablet PO daily for 4 days 07/20/13   Joya Gaskins, MD  predniSONE (DELTASONE) 50 MG tablet One tablet a day 09/22/13   Elson Areas, PA-C   Triage Vitals: BP 143/78  Pulse 86  Temp(Src) 98.5 F (36.9 C) (Oral)  SpO2 100% Physical Exam  Nursing note and  vitals reviewed. Constitutional: He is oriented to person, place, and time. He appears well-developed and well-nourished.  HENT:  Head: Normocephalic and atraumatic.  Eyes: EOM are normal.  Neck: Normal range of motion.  Cardiovascular: Normal rate, regular rhythm and normal heart sounds.  Exam reveals no gallop and no friction rub.   No murmur heard. Pulmonary/Chest: Effort normal and breath sounds normal. No respiratory distress. He has no decreased breath sounds. He has no wheezes. He has no rhonchi. He has no rales.  Musculoskeletal:       Right ankle: He exhibits decreased range of motion and  swelling. He exhibits no ecchymosis, no deformity, no laceration and normal pulse. No tenderness. No lateral malleolus, no medial malleolus, no AITFL, no CF ligament, no posterior TFL, no head of 5th metatarsal and no proximal fibula tenderness found. Achilles tendon normal.       Feet:  There is small 1 cm circular callous to the pad of the foot with central bruising.  No surrounding erythema.  No fluctuance on palpation.    Neurological: He is alert and oriented to person, place, and time.  Skin: Skin is warm and dry.  Psychiatric: He has a normal mood and affect. His behavior is normal. Judgment and thought content normal.    ED Course  Procedures (including critical care time) DIAGNOSTIC STUDIES: Oxygen Saturation is 100% on RA, normal by my interpretation.   COORDINATION OF CARE: 5:48 PM- Will X-Ray right foot and order pain medication. Pt verbalizes understanding and agrees to plan.  Medications - No data to display  Labs Review Labs Reviewed - No data to display  Imaging Review Dg Foot Complete Right  12/25/2013   CLINICAL DATA:  Struck RIGHT great toe, pain  EXAM: RIGHT FOOT COMPLETE - 3+ VIEW  COMPARISON:  08/12/2013  FINDINGS: Osseous mineralization normal.  Joint spaces preserved.  No acute fracture, dislocation or bone destruction.  IMPRESSION: No acute osseous abnormalities.   Electronically Signed   By: Ulyses Southward M.D.   On: 12/25/2013 18:28     EKG Interpretation None      MDM   Final diagnoses:  Podagra  Acute gout of right foot, unspecified cause   Patient is a 23 y.o. Male who presents to the ED with right foot pain.  Physical examination was remarkable only for some small erythema of the right foot and tenderness to palpation.  There is a corn on the bottom of the foot.  Plain film xrays are negative at this time.  Will provide a prescription for naproxen BID AC and hydrocodone.  Patient is stable for discharge at this time.  Given normal vitals and physical  examination doubt septic joint at this time.  Patient was told to return for septic joint symptoms.  Patient states understanding and agreement at this time.    I personally performed the services described in this documentation, which was scribed in my presence. The recorded information has been reviewed and is accurate.    Eben Burow, PA-C 12/25/13 2122

## 2014-11-08 ENCOUNTER — Encounter (HOSPITAL_COMMUNITY): Payer: Self-pay | Admitting: *Deleted

## 2014-11-08 ENCOUNTER — Emergency Department (HOSPITAL_COMMUNITY)
Admission: EM | Admit: 2014-11-08 | Discharge: 2014-11-08 | Disposition: A | Discharge status: Home / Self Care | Payer: 59 | Attending: Emergency Medicine | Admitting: Emergency Medicine

## 2014-11-08 DIAGNOSIS — Z791 Long term (current) use of non-steroidal anti-inflammatories (NSAID): Secondary | ICD-10-CM | POA: Insufficient documentation

## 2014-11-08 DIAGNOSIS — Z720 Tobacco use: Secondary | ICD-10-CM | POA: Insufficient documentation

## 2014-11-08 DIAGNOSIS — K029 Dental caries, unspecified: Secondary | ICD-10-CM | POA: Insufficient documentation

## 2014-11-08 DIAGNOSIS — M109 Gout, unspecified: Secondary | ICD-10-CM | POA: Insufficient documentation

## 2014-11-08 MED ORDER — IBUPROFEN 800 MG PO TABS: 800.0000 mg | ORAL_TABLET | Freq: Three times a day (TID) | ORAL | Status: DC | PRN

## 2014-11-08 MED ORDER — BUPIVACAINE HCL (PF) 0.25 % IJ SOLN
10.0000 mL | Freq: Once | INTRAMUSCULAR | Status: AC
  Administered 2014-11-08: 04:00:00
  Filled 2014-11-08: qty 30

## 2014-11-08 MED ORDER — HYDROCODONE-ACETAMINOPHEN 5-325 MG PO TABS: 1.0000 | ORAL_TABLET | ORAL | Status: DC | PRN

## 2014-11-08 MED ORDER — HYDROCODONE-ACETAMINOPHEN 5-325 MG PO TABS
2.0000 | ORAL_TABLET | Freq: Once | ORAL | Status: AC
  Administered 2014-11-08: 1 via ORAL
  Filled 2014-11-08: qty 2

## 2014-11-08 MED ORDER — PENICILLIN V POTASSIUM 250 MG PO TABS
500.0000 mg | ORAL_TABLET | Freq: Once | ORAL | Status: AC
  Administered 2014-11-08: 500 mg via ORAL
  Filled 2014-11-08: qty 2

## 2014-11-08 MED ORDER — LIDOCAINE-EPINEPHRINE (PF) 1 %-1:200000 IJ SOLN
10.0000 mL | Freq: Once | INTRAMUSCULAR | Status: AC
  Administered 2014-11-08: 10 mL
  Filled 2014-11-08: qty 10

## 2014-11-08 MED ORDER — PENICILLIN V POTASSIUM 500 MG PO TABS: 500.0000 mg | ORAL_TABLET | Freq: Four times a day (QID) | ORAL | Status: DC

## 2014-11-08 MED ORDER — IBUPROFEN 800 MG PO TABS
800.0000 mg | ORAL_TABLET | Freq: Once | ORAL | Status: AC
  Administered 2014-11-08: 800 mg via ORAL
  Filled 2014-11-08: qty 1

## 2014-11-08 NOTE — Discharge Instructions (Signed)
Dental Care and Dentist Visits °Dental care supports good overall health. Regular dental visits can also help you avoid dental pain, bleeding, infection, and other more serious health problems in the future. It is important to keep the mouth healthy because diseases in the teeth, gums, and other oral tissues can spread to other areas of the body. Some problems, such as diabetes, heart disease, and pre-term labor have been associated with poor oral health.  °See your dentist every 6 months. If you experience emergency problems such as a toothache or broken tooth, go to the dentist right away. If you see your dentist regularly, you may catch problems early. It is easier to be treated for problems in the early stages.  °WHAT TO EXPECT AT A DENTIST VISIT  °Your dentist will look for many common oral health problems and recommend proper treatment. At your regular dental visit, you can expect: °· Gentle cleaning of the teeth and gums. This includes scraping and polishing. This helps to remove the sticky substance around the teeth and gums (plaque). Plaque forms in the mouth shortly after eating. Over time, plaque hardens on the teeth as tartar. If tartar is not removed regularly, it can cause problems. Cleaning also helps remove stains. °· Periodic X-rays. These pictures of the teeth and supporting bone will help your dentist assess the health of your teeth. °· Periodic fluoride treatments. Fluoride is a natural mineral shown to help strengthen teeth. Fluoride treatment involves applying a fluoride gel or varnish to the teeth. It is most commonly done in children. °· Examination of the mouth, tongue, jaws, teeth, and gums to look for any oral health problems, such as: °· Cavities (dental caries). This is decay on the tooth caused by plaque, sugar, and acid in the mouth. It is best to catch a cavity when it is small. °· Inflammation of the gums caused by plaque buildup (gingivitis). °· Problems with the mouth or malformed  or misaligned teeth. °· Oral cancer or other diseases of the soft tissues or jaws.  °KEEP YOUR TEETH AND GUMS HEALTHY °For healthy teeth and gums, follow these general guidelines as well as your dentist's specific advice: °· Have your teeth professionally cleaned at the dentist every 6 months. °· Brush twice daily with a fluoride toothpaste. °· Floss your teeth daily.  °· Ask your dentist if you need fluoride supplements, treatments, or fluoride toothpaste. °· Eat a healthy diet. Reduce foods and drinks with added sugar. °· Avoid smoking. °TREATMENT FOR ORAL HEALTH PROBLEMS °If you have oral health problems, treatment varies depending on the conditions present in your teeth and gums. °· Your caregiver will most likely recommend good oral hygiene at each visit. °· For cavities, gingivitis, or other oral health disease, your caregiver will perform a procedure to treat the problem. This is typically done at a separate appointment. Sometimes your caregiver will refer you to another dental specialist for specific tooth problems or for surgery. °SEEK IMMEDIATE DENTAL CARE IF: °· You have pain, bleeding, or soreness in the gum, tooth, jaw, or mouth area. °· A permanent tooth becomes loose or separated from the gum socket. °· You experience a blow or injury to the mouth or jaw area. °Document Released: 12/15/2010 Document Revised: 06/27/2011 Document Reviewed: 12/15/2010 °ExitCare® Patient Information ©2015 [employer]. This information is not intended to replace advice given to you by your health care provider. Make sure you discuss any questions you have with your health care provider. °Dental Caries °Dental caries (also called tooth decay) is   the most common oral disease. It can occur at any age but is more common in children and young adults.  °HOW DENTAL CARIES DEVELOPS  °The process of decay begins when bacteria and foods (particularly sugars and starches) combine in your mouth to produce plaque. Plaque is a  substance that sticks to the hard, outer surface of a tooth (enamel). The bacteria in plaque produce acids that attack enamel. These acids may also attack the root surface of a tooth (cementum) if it is exposed. Repeated attacks dissolve these surfaces and create holes in the tooth (cavities). If left untreated, the acids destroy the other layers of the tooth.  °RISK FACTORS °· Frequent sipping of sugary beverages.   °· Frequent snacking on sugary and starchy foods, especially those that easily get stuck in the teeth.   °· Poor oral hygiene.   °· Dry mouth.   °· Substance abuse such as methamphetamine abuse.   °· Broken or poor-fitting dental restorations.   °· Eating disorders.   °· Gastroesophageal reflux disease (GERD).   °· Certain radiation treatments to the head and neck. °SYMPTOMS °In the early stages of dental caries, symptoms are seldom present. Sometimes white, chalky areas may be seen on the enamel or other tooth layers. In later stages, symptoms may include: °· Pits and holes on the enamel. °· Toothache after sweet, hot, or cold foods or drinks are consumed. °· Pain around the tooth. °· Swelling around the tooth. °DIAGNOSIS  °Most of the time, dental caries is detected during a regular dental checkup. A diagnosis is made after a thorough medical and dental history is taken and the surfaces of your teeth are checked for signs of dental caries. Sometimes special instruments, such as lasers, are used to check for dental caries. Dental X-ray exams may be taken so that areas not visible to the eye (such as between the contact areas of the teeth) can be checked for cavities.  °TREATMENT  °If dental caries is in its early stages, it may be reversed with a fluoride treatment or an application of a remineralizing agent at the dental office. Thorough brushing and flossing at home is needed to aid these treatments. If it is in its later stages, treatment depends on the location and extent of tooth destruction:   °· If a small area of the tooth has been destroyed, the destroyed area will be removed and cavities will be filled with a material such as gold, silver amalgam, or composite resin.   °· If a large area of the tooth has been destroyed, the destroyed area will be removed and a cap (crown) will be fitted over the remaining tooth structure.   °· If the center part of the tooth (pulp) is affected, a procedure called a root canal will be needed before a filling or crown can be placed.   °· If most of the tooth has been destroyed, the tooth may need to be pulled (extracted). °HOME CARE INSTRUCTIONS °You can prevent, stop, or reverse dental caries at home by practicing good oral hygiene. Good oral hygiene includes: °· Thoroughly cleaning your teeth at least twice a day with a toothbrush and dental floss.   °· Using a fluoride toothpaste. A fluoride mouth rinse may also be used if recommended by your dentist or health care provider.   °· Restricting the amount of sugary and starchy foods and sugary liquids you consume.   °· Avoiding frequent snacking on these foods and sipping of these liquids.   °· Keeping regular visits with a dentist for checkups and cleanings. °PREVENTION  °·   Practice good oral hygiene. °· Consider a dental sealant. A dental sealant is a coating material that is applied by your dentist to the pits and grooves of teeth. The sealant prevents food from being trapped in them. It may protect the teeth for several years. °· Ask about fluoride supplements if you live in a community without fluorinated water or with water that has a low fluoride content. Use fluoride supplements as directed by your dentist or health care provider. °· Allow fluoride varnish applications to teeth if directed by your dentist or health care provider. °Document Released: 12/25/2001 Document Revised: 08/19/2013 Document Reviewed: 04/06/2012 °ExitCare® Patient Information ©2015 [employer]. This information is not intended to  replace advice given to you by your health care provider. Make sure you discuss any questions you have with your health care provider. ° ° ° °Emergency Department Resource Guide °1) Find a Doctor and Pay Out of Pocket °Although you won't have to find out who is covered by your insurance plan, it is a good idea to ask around and get recommendations. You will then need to call the office and see if the doctor you have chosen will accept you as a new patient and what types of options they offer for patients who are self-pay. Some doctors offer discounts or will set up payment plans for their patients who do not have insurance, but you will need to ask so you aren't surprised when you get to your appointment. ° °2) Contact Your Local Health Department °Not all health departments have doctors that can see patients for sick visits, but many do, so it is worth a call to see if yours does. If you don't know where your local health department is, you can check in your phone book. The CDC also has a tool to help you locate your state's health department, and many state websites also have listings of all of their local health departments. ° °3) Find a Walk-in Clinic °If your illness is not likely to be very severe or complicated, you may want to try a walk in clinic. These are popping up all over the country in pharmacies, drugstores, and shopping centers. They're usually staffed by nurse practitioners or physician assistants that have been trained to treat common illnesses and complaints. They're usually fairly quick and inexpensive. However, if you have serious medical issues or chronic medical problems, these are probably not your best option. ° °No Primary Care Doctor: °- Call [facility] at  832-8000 - they can help you locate a primary care doctor that  accepts your insurance, provides certain services, etc. °- Physician Referral Service- 1-800-533-3463 ° °Chronic Pain Problems: °Organization         Address  Phone    Notes  °[facility]  (336) 297-2271 Patients need to be referred by their primary care doctor.  ° °Medication Assistance: °Organization         Address  Phone   Notes  °[place] [facility] [address] °[place], [place] [address] (336) 641-8030 --Must be a resident of [place] °-- Must have NO insurance coverage whatsoever (no Medicaid/ Medicare, etc.) °-- The pt. MUST have a primary care doctor that directs their care regularly and follows them in the community °  °[facility]  (866) 331-1348   °[employer]  (888) 892-1162   ° °Agencies that provide inexpensive medical care: °Organization         Address  Phone     Notes  °[facility]  (336) 832-8035   °[facility]    (336) 832-7272   °[facility] [address] °[place], [place] [address] (336) 832-4777   °[facility] [address], °[place] (336) 271-4999   °[facility]    (336) 373-0678   °[facility]    (336) 272-1050   °[facility] ° [address], [place] Phone:  (336) 832-4444, Fax:  (336) 832-4440 Hours of Operation:  9 am - 6 pm, M-F.  Also accepts Medicaid/Medicare and self-pay.  °[facility] ° [address], [place] Phone: (336) 832-3150, Fax: (336) 832-3151. Hours of Operation:  8:30 am - 5:30 pm, M-F.  Also accepts Medicaid and self-pay.  °[facility] [place] [address], [place] Phone: (336) 878-6027   °[facility] [address], [place], [place] (336)723-1848, Ext. 123 Mondays & Thursdays: 7-9 AM.  First 15 patients are seen on a first come, first serve basis. °  ° °Medicaid-accepting [place] Providers: ° °Organization         Address  Phone   Notes  °[facility] [address], [place] (336) 641-2100 Also accepts self-pay patients.  °[facility]  [address], [place] ° (336) 856-9996   °[facility] [address], [place] (336) 288-8857   °[facility] [address] [place] [address], [place] (336) 299-7000   °Veita Bland [address], [place]  ° (336) 373-1557 Only accepts [place] Access Medicaid patients after they have their name applied to their card.  ° °Self-Pay (no insurance) in [place]: ° °Organization         Address  Phone   Notes  °Sickle Cell Patients, [facility] [address], [place] (336) 832-1970   °[facility] [address], [place] (336) 832-4400   °[facility] ° [address], [place] (336) 992-4800   °[facility]/Dr. Osei-Bonsu ° [address] [place] [address], [place] or [address], [place] (336) [phone] Phone number for both [place] and [place] locations is the same.  °[facility] [address], [place] (336) 299-0000   °[facility] [address] [place] [address], [place] or [address] (336) 852-7530 °(336) 878-2260   °[facility] [address], [place] (336) 350-1642, phone; (336) 294-5005, fax Sees patients 1st and 3rd Saturday of every month.  Must not qualify for public or private insurance (i.e. Medicaid, Medicare, [place] Health Choice, Veterans' Benefits) • Household income should be no more than 200% of the poverty level •The clinic cannot treat you if you are pregnant or think you are pregnant • Sexually transmitted diseases are not treated at the clinic.  ° ° °Dental Care: °Organization         Address  Phone  Notes  °[place] [facility] [address], [place] (336) 641-6152 Accepts children up to age 21 who are enrolled in Medicaid or  Health Choice; pregnant women with a Medicaid card; and children who have  applied for Medicaid or  Health Choice, but were declined, whose parents can pay a reduced fee at time of service.  °[place]   [facility] [place]  [address], [place] (336) 641-7733 Accepts children up to age 21 who are enrolled in Medicaid or [place] Health Choice; pregnant women with a Medicaid card; and children who have applied for Medicaid or [place] Health Choice, but were declined, whose parents can pay a reduced fee at time of service.  °[facility] ° [address], [place] (336) 641-4533 Patients are seen by appointment only. Walk-ins are not accepted. [facility] will see patients 18 years of age and older. °Monday - Tuesday (8am-5pm) °Most Wednesdays (8:30-5pm) °$30 per visit, cash only  °[facility] ° [address], [place] (336) 641-4533 Patients are seen by appointment only. Walk-ins are not accepted. [facility] will see patients 18 years of age and older. °One Wednesday Evening (Monthly: Volunteer Based).  $30 per visit, cash only  °[facility]  (919) 537-3737 for adults; Children under age 4, call Graduate Pediatric Dentistry at ([phone]) [phone]. Children aged 4-14, please call ([phone]) [phone] to request a pediatric application. ° Dental services are provided in all areas of dental care including fillings, crowns and bridges, complete and partial dentures, implants, gum treatment, root canals, and extractions. Preventive care is also provided. Treatment is provided to both adults and children. °Patients are selected via a lottery and there is often a waiting list. °  °[facility] [address], °[place] ° (336) 763-8833 [email or web address] °  °[facility] [address], [place], [place] (336)723-1848, Ext. 123 Second and Fourth Thursday of each month, opens at 6:30 AM; Clinic ends at 9 AM.  Patients are seen on a first-come first-served basis, and a  limited number are seen during each clinic.  ° °[facility] ° [address], [place], [place] (336) 723-7904   Eligibility Requirements °You must have lived in [place], [place], or [place] counties for at least the last three months. °  You cannot be eligible for state or federal sponsored healthcare insurance, including Veterans Administration, Medicaid, or Medicare. °  You generally cannot be eligible for healthcare insurance through your employer.  °  How to apply: °Eligibility screenings are held every Tuesday and Wednesday afternoon from 1:00 pm until 4:00 pm. You do not need an appointment for the interview!  °[facility] [address], [place], [place] 336-631-2330   °[facility] [facility]  336-342-8273   °[facility]  336-703-3100   °[facility]  336-570-6415   ° °Behavioral Health Resources in the Community: °Intensive Outpatient Programs °Organization         Address  Phone  Notes  °[facility] [address], [place], [place] 336-878-6098   °[facility] [address], [place], [place] 336-832-9800   °[facility] [address], [place], [place] ° 336-882-2125   °[facility] [address],  °[place],  1-800-853-5163 or 336-641-4981   °Substance Abuse Resources °Organization         Address  Phone  Notes  °[facility]  336-882-2125   °[facility]  336-784-9470   °[facility]  336-285-9073   °[facility]  336-845-3988   °Residential & Outpatient Substance Abuse Program  1-800-659-3381   °Psychological Services °Organization         Address  Phone  Notes  °[facility]  336- 832-9600   °[facility]  336- 378-7881   °[facility] [address], [place] 1-800-853-5163 or 336-641-4981   ° °Mobile Crisis Teams °Organization          Address  Phone  Notes  °[facility], Mobile Crisis Care Unit  1-877-626-1772   °Assertive °[facility] ° [address] [place], [place] 336-834-9664   °Sharon DeEsch [address] °[place] 336-554-5454   ° °Self-Help/Support Groups °Organization         Address  Phone             Notes  °[facility] - variety of support groups  336- 373-1402 Call for more information  °Narcotics Anonymous (NA), [facility] [address], °[place]  2 meetings at this location  ° °Residential Treatment Programs °Organization         Address  Phone  Notes  °[facility] [address],    °[place]  1-866-801-8205   °[facility] ° [address], [place], [place] 704-293-8524   °[facility] [address], [place] 336-845-3988 Admissions: 8am-3pm M-F  °[facility] [address]    °[place], [place] 336-841-1104   °[facility] [address], [place], [place] 336-379-7146   °[facility] [address]  °[place], [place] 336-285-9073   °[facility] Intensive Outpatient [address], [place], [place] 336-852-3033   °[facility] [address]  °[place], [place] 1-877-615-2722 or 336-784-9470   °[facility] [address] [place], [place] 336-227-7417 Accepts Medicaid  °[facility] [address]  °[place] 1-800-659-3381 Substance Abuse/Addiction Treatment  ° °[place] Behavioral Health Resources °Organization         Address  Phone  Notes  °[facility]  (888) 581-9988   °Julie Brannon, PhD [address] [place], [place]   (336) 349-5553 or (336) 951-0000   ° Behavioral   [address] °[place], [place] (336) 349-4454   °[facility] [address], [place], [place] (336) 342-8316 Insurance/Medicaid/sponsorship  through [facility]  °[facility] [address]                                    [place], [place] (336) 342-8316 Therapy/tele-psych/case  °[facility] [address]  ° [place], [place] (336) 349-2233    °Dr. Arfeen  (336) 349-4544   °[facility] [place]  [employer] [place] [facility] 1) [address], [place] °2) [address], [place] °3)  [address], [place] (336) 349-3220 °(336) 342-7768 ° °(336) 342-8140   °[place] [facility] (336) 342-1394 or (336) 342-3537 (After Hours)    ° ° ° °

## 2014-11-08 NOTE — ED Notes (Signed)
Pt reporting broken tooth on back left side.  States that tooth broke about 2 months ago and he's been using a temporary filling material.  No relief tonight.

## 2014-11-08 NOTE — ED Provider Notes (Signed)
TIME SEEN: 3:45 AM  CHIEF COMPLAINT: Left upper dental pain  HPI: Pt is a 24 y.o. male with history of gout who presents to the emergency department with left upper molar pain. States that this tooth broke while eating several months ago. He reports the pain has been uncontrolled tonight. Causing him to have a headache. No fever, facial swelling, erythema or warmth. No difficulty swallowing his saliva. He does not have a dentist. Requesting an injection to numb his tooth.  ROS: See HPI Constitutional: no fever  Eyes: no drainage  ENT: no runny nose   Cardiovascular:  no chest pain  Resp: no SOB  GI: no vomiting GU: no dysuria Integumentary: no rash  Allergy: no hives  Musculoskeletal: no leg swelling  Neurological: no slurred speech ROS otherwise negative  PAST MEDICAL HISTORY/PAST SURGICAL HISTORY:  Past Medical History  Diagnosis Date  . Gout     MEDICATIONS:  Prior to Admission medications   Medication Sig Start Date End Date Taking? Authorizing Provider  HYDROcodone-acetaminophen (NORCO) 5-325 MG per tablet Take 1 tablet by mouth every 6 (six) hours as needed for moderate pain. 12/25/13   Courtney Forcucci, PA-C  HYDROcodone-acetaminophen (NORCO/VICODIN) 5-325 MG per tablet Take 2 tablets by mouth every 4 (four) hours as needed. 09/22/13   Elson Areas, PA-C  HYDROcodone-acetaminophen (NORCO/VICODIN) 5-325 MG per tablet Take 2 tablets by mouth every 4 (four) hours as needed. 10/05/13   Elson Areas, PA-C  HYDROcodone-acetaminophen (NORCO/VICODIN) 5-325 MG per tablet Take 2 tablets by mouth every 4 (four) hours as needed. 10/08/13   Rolan Bucco, MD  indomethacin (INDOCIN) 25 MG capsule Take 1 capsule (25 mg total) by mouth 3 (three) times daily as needed. 10/08/13   Rolan Bucco, MD  indomethacin (INDOCIN) 25 MG capsule Take 1 capsule (25 mg total) by mouth 3 (three) times daily as needed. 10/13/13   Elpidio Anis, PA-C  naproxen (NAPROSYN) 500 MG tablet Take 1 tablet (500 mg  total) by mouth 2 (two) times daily. 12/25/13   Courtney Forcucci, PA-C  predniSONE (DELTASONE) 50 MG tablet One tablet PO daily for 4 days 07/20/13   Zadie Rhine, MD  predniSONE (DELTASONE) 50 MG tablet One tablet a day 09/22/13   Elson Areas, PA-C    ALLERGIES:  No Known Allergies  SOCIAL HISTORY:  History  Substance Use Topics  . Smoking status: Current Every Day Smoker    Types: Cigarettes  . Smokeless tobacco: Not on file  . Alcohol Use: No    FAMILY HISTORY: History reviewed. No pertinent family history.  EXAM: BP 130/79 mmHg  Pulse 81  Temp(Src) 98.1 F (36.7 C) (Oral)  Resp 20  Ht 6' (1.829 m)  Wt 170 lb (77.111 kg)  BMI 23.05 kg/m2  SpO2 99% CONSTITUTIONAL: Alert and oriented and responds appropriately to questions. Appears uncomfortable but is nontoxic, afebrile HEAD: Normocephalic EYES: Conjunctivae clear, PERRL ENT: normal nose; no rhinorrhea; moist mucous membranes; pharynx without lesions noted, no trismus or drooling, no stridor, normal phonation, multiple dental caries but he is left upper third molar is completely broken to the gumline with obvious decay, no obvious abscess, no Ludwig exam general, no facial erythema or warmth or swelling NECK: Supple, no meningismus, no LAD  CARD: RRR; S1 and S2 appreciated; no murmurs, no clicks, no rubs, no gallops RESP: Normal chest excursion without splinting or tachypnea; breath sounds clear and equal bilaterally; no wheezes, no rhonchi, no rales, no hypoxia or respiratory distress, speaking full sentences  ABD/GI: Normal bowel sounds; non-distended; soft, non-tender, no rebound, no guarding, no peritoneal signs BACK:  The back appears normal and is non-tender to palpation, there is no CVA tenderness EXT: Normal ROM in all joints; non-tender to palpation; no edema; normal capillary refill; no cyanosis, no calf tenderness or swelling    SKIN: Normal color for age and race; warm NEURO: Moves all extremities equally,  sensation to light touch intact diffusely, cranial nerves II through XII intact PSYCH: The patient's mood and manner are appropriate. Grooming and personal hygiene are appropriate.  MEDICAL DECISION MAKING: Patient here with dental pain secondary to dental caries, no obvious abscess and no signs of Ludwig angina. Performed dental block with good relief.  We'll discharge with dental follow-up information. We'll place on prophylactic penicillin. Discussed return precautions. Patient verbalizes understanding and is comfortable with plan.   Dental Block Date/Time: 11/08/2014 4:15 AM Performed by: Raelyn Number Authorized by: Raelyn Number Consent: Verbal consent obtained. Risks and benefits: risks, benefits and alternatives were discussed Consent given by: patient Patient understanding: patient states understanding of the procedure being performed Patient identity confirmed: verbally with patient Time out: Immediately prior to procedure a "time out" was called to verify the correct patient, procedure, equipment, support staff and site/side marked as required. Preparation: Patient was prepped and draped in the usual sterile fashion. Local anesthesia used: yes - 2.5 mL of bupivacaine 0.25% and 2.5 mL of 2% lidocaine with epinephrine  Patient sedated: no Patient tolerance: Patient tolerated the procedure well with no immediate complications      Layla Maw Ward, DO 11/08/14 [phone]

## 2014-12-30 ENCOUNTER — Emergency Department (HOSPITAL_COMMUNITY)
Admission: EM | Admit: 2014-12-30 | Discharge: 2014-12-30 | Disposition: A | Discharge status: Home / Self Care | Payer: Self-pay | Attending: Emergency Medicine | Admitting: Emergency Medicine

## 2014-12-30 ENCOUNTER — Encounter (HOSPITAL_COMMUNITY): Payer: Self-pay | Admitting: Emergency Medicine

## 2014-12-30 ENCOUNTER — Emergency Department (HOSPITAL_COMMUNITY): Payer: 59

## 2014-12-30 DIAGNOSIS — R220 Localized swelling, mass and lump, head: Secondary | ICD-10-CM

## 2014-12-30 DIAGNOSIS — K0889 Other specified disorders of teeth and supporting structures: Secondary | ICD-10-CM

## 2014-12-30 DIAGNOSIS — J039 Acute tonsillitis, unspecified: Secondary | ICD-10-CM | POA: Insufficient documentation

## 2014-12-30 DIAGNOSIS — M109 Gout, unspecified: Secondary | ICD-10-CM | POA: Insufficient documentation

## 2014-12-30 DIAGNOSIS — Z720 Tobacco use: Secondary | ICD-10-CM | POA: Insufficient documentation

## 2014-12-30 DIAGNOSIS — Z7952 Long term (current) use of systemic steroids: Secondary | ICD-10-CM | POA: Insufficient documentation

## 2014-12-30 DIAGNOSIS — Z792 Long term (current) use of antibiotics: Secondary | ICD-10-CM | POA: Insufficient documentation

## 2014-12-30 DIAGNOSIS — Z791 Long term (current) use of non-steroidal anti-inflammatories (NSAID): Secondary | ICD-10-CM | POA: Insufficient documentation

## 2014-12-30 DIAGNOSIS — K088 Other specified disorders of teeth and supporting structures: Secondary | ICD-10-CM | POA: Insufficient documentation

## 2014-12-30 DIAGNOSIS — R600 Localized edema: Secondary | ICD-10-CM | POA: Insufficient documentation

## 2014-12-30 LAB — CBC WITH DIFFERENTIAL/PLATELET
BASOS ABS: 0 10*3/uL (ref 0.0–0.1)
Basophils Relative: 0 % (ref 0–1)
Eosinophils Absolute: 0 10*3/uL (ref 0.0–0.7)
Eosinophils Relative: 0 % (ref 0–5)
HEMATOCRIT: 43.3 % (ref 39.0–52.0)
HEMOGLOBIN: 15.4 g/dL (ref 13.0–17.0)
LYMPHS PCT: 8 % — AB (ref 12–46)
Lymphs Abs: 1.3 10*3/uL (ref 0.7–4.0)
MCH: 28.7 pg (ref 26.0–34.0)
MCHC: 35.6 g/dL (ref 30.0–36.0)
MCV: 80.8 fL (ref 78.0–100.0)
Monocytes Absolute: 1.6 10*3/uL — ABNORMAL HIGH (ref 0.1–1.0)
Monocytes Relative: 10 % (ref 3–12)
NEUTROS ABS: 13.3 10*3/uL — AB (ref 1.7–7.7)
Neutrophils Relative %: 82 % — ABNORMAL HIGH (ref 43–77)
Platelets: 254 10*3/uL (ref 150–400)
RBC: 5.36 MIL/uL (ref 4.22–5.81)
RDW: 14 % (ref 11.5–15.5)
WBC: 16.2 10*3/uL — AB (ref 4.0–10.5)

## 2014-12-30 LAB — BASIC METABOLIC PANEL
Anion gap: 8 (ref 5–15)
BUN: 7 mg/dL (ref 6–20)
CHLORIDE: 101 mmol/L (ref 101–111)
CO2: 25 mmol/L (ref 22–32)
Calcium: 9 mg/dL (ref 8.9–10.3)
Creatinine, Ser: 0.52 mg/dL — ABNORMAL LOW (ref 0.61–1.24)
GFR calc Af Amer: >60 mL/min (ref >60)
Glucose, Bld: 118 mg/dL — ABNORMAL HIGH (ref 65–99)
POTASSIUM: 3.8 mmol/L (ref 3.5–5.1)
SODIUM: 134 mmol/L — AB (ref 135–145)

## 2014-12-30 MED ORDER — BUPIVACAINE HCL (PF) 0.5 % IJ SOLN: 30.0000 mL | Freq: Once | INTRAMUSCULAR | Status: DC

## 2014-12-30 MED ORDER — CLINDAMYCIN PHOSPHATE 300 MG/50ML IV SOLN
300.0000 mg | Freq: Once | INTRAVENOUS | Status: AC
  Administered 2014-12-30: 300 mg via INTRAVENOUS
  Filled 2014-12-30: qty 50

## 2014-12-30 MED ORDER — CLINDAMYCIN HCL 300 MG PO CAPS: 300.0000 mg | ORAL_CAPSULE | Freq: Three times a day (TID) | ORAL | Status: DC

## 2014-12-30 MED ORDER — IBUPROFEN 800 MG PO TABS
800.0000 mg | ORAL_TABLET | Freq: Once | ORAL | Status: AC
  Administered 2014-12-30: 800 mg via ORAL

## 2014-12-30 MED ORDER — IBUPROFEN 800 MG PO TABS: 800.0000 mg | ORAL_TABLET | Freq: Three times a day (TID) | ORAL | Status: DC

## 2014-12-30 MED ORDER — SODIUM CHLORIDE 0.9 % IV SOLN
1000.0000 mL | Freq: Once | INTRAVENOUS | Status: AC
  Administered 2014-12-30: 1000 mL via INTRAVENOUS

## 2014-12-30 MED ORDER — IBUPROFEN 800 MG PO TABS
ORAL_TABLET | ORAL | Status: AC
  Filled 2014-12-30: qty 1

## 2014-12-30 MED ORDER — MORPHINE SULFATE (PF) 4 MG/ML IV SOLN
4.0000 mg | Freq: Once | INTRAVENOUS | Status: AC
  Administered 2014-12-30: 4 mg via INTRAVENOUS
  Filled 2014-12-30: qty 1

## 2014-12-30 MED ORDER — IOHEXOL 300 MG/ML  SOLN
75.0000 mL | Freq: Once | INTRAMUSCULAR | Status: AC | PRN
  Administered 2014-12-30: 75 mL via INTRAVENOUS

## 2014-12-30 MED ORDER — SODIUM CHLORIDE 0.9 % IV SOLN
1000.0000 mL | INTRAVENOUS | Status: DC
  Administered 2014-12-30: 1000 mL via INTRAVENOUS

## 2014-12-30 MED ORDER — BUPIVACAINE-EPINEPHRINE (PF) 0.5% -1:200000 IJ SOLN
INTRAMUSCULAR | Status: AC
  Administered 2014-12-30: 30 mL
  Filled 2014-12-30: qty 30

## 2014-12-30 NOTE — ED Notes (Signed)
Pt c/o mouth swelling since yesterday.

## 2014-12-30 NOTE — ED Provider Notes (Signed)
Ct Soft Tissue Neck W Contrast  12/30/2014   CLINICAL DATA:  24 year old male with bowel and neck swelling and pain for 6 days.  EXAM: CT NECK WITH CONTRAST  TECHNIQUE: Multidetector CT imaging of the neck was performed using the standard protocol following the bolus administration of intravenous contrast.  CONTRAST:  75mL OMNIPAQUE IOHEXOL 300 MG/ML  SOLN  COMPARISON:  None.  FINDINGS: Pharynx and larynx: Thickening of the right pharynx/tonsils noted without abscess. There is mild right subcutaneous facial soft tissue swelling. The larynx is unremarkable.  Salivary glands: Unremarkable  Thyroid: Unremarkable  Lymph nodes: Prominent right cervical lymph nodes are likely reactive. No suspicious lymph nodes are identified.  Vascular: Unremarkable  Limited intracranial: Unremarkable  Visualized orbits: Unremarkable  Mastoids and visualized paranasal sinuses: Unremarkable  Skeleton: Unremarkable. No discrete periapical dental abscess identified.  Upper chest: Unremarkable  IMPRESSION: Right pharyngeal/tonsillar thickening compatible with pharyngitis/tonsillitis. No discrete abscess. Right subcutaneous facial soft tissue swelling.   Electronically Signed   By: Harmon Pier M.D.   On: 12/30/2014 07:28   I personally reviewed the imaging tests through PACS system I reviewed available ER/hospitalization records through the EMR  NERVE BLOCK (MENTAL NERVE) Performed by: Lyanne Co Consent: Verbal consent obtained. Required items: required blood products, implants, devices, and special equipment available Time out: Immediately prior to procedure a "time out" was called to verify the correct patient, procedure, equipment, support staff and site/side marked as required. Indication: facial pain Nerve block body site: right mental nerve Preparation: Patient was prepped and draped in the usual sterile fashion. Needle gauge: 24 G Location technique: anatomical landmarks Local anesthetic: bupivicaine  0.5% Anesthetic total: 4 ml Outcome: pain improved Patient tolerance: Patient tolerated the procedure well with no immediate complications.  NEEDLE ASPIRATION Performed by: Lyanne Co Consent: Verbal consent obtained. Risks and benefits: risks, benefits and alternatives were discussed Time out performed prior to procedure Type: facial swelling Body area: angle of right mandible Anesthesia: NERVE BLOCK (SEE NOTE) Aspiration with 21 gauge needle Drainage: none Patient tolerance: Patient tolerated the procedure well with no immediate complications.   No signs of PTA. Uvula midline. Tolerating secretions. Improvement in pain after nerve block Dc home with clindamycin and ibuprofen. Recommended ice and dental follow up. Pt understands to return to ER for new or worsening symptoms        Azalia Bilis, MD 12/30/14 [phone]

## 2014-12-30 NOTE — ED Provider Notes (Signed)
CSN: [id number]     Arrival date & time 12/30/14  4098 History   First MD Initiated Contact with Patient 12/30/14 [phone]     Chief Complaint  Patient presents with  . Dental Pain     (Consider location/radiation/quality/duration/timing/severity/associated sxs/prior Treatment) Patient is a 24 y.o. male presenting with tooth pain. The history is provided by the patient.  Dental Pain He comes in with about 24-hour history of pain and swelling in the right side of his jaw. Pain is severe and he rates it at 10/10. He states she has difficulty opening his mouth and he is having some difficulty swallowing. He denies fever or chills. He has not taken anything for pain.  Past Medical History  Diagnosis Date  . Gout    Past Surgical History  Procedure Laterality Date  . Toe surgery     History reviewed. No pertinent family history. Social History  Substance Use Topics  . Smoking status: Current Every Day Smoker    Types: Cigarettes  . Smokeless tobacco: None  . Alcohol Use: No    Review of Systems  All other systems reviewed and are negative.     Allergies  Review of patient's allergies indicates no known allergies.  Home Medications   Prior to Admission medications   Medication Sig Start Date End Date Taking? Authorizing Provider  HYDROcodone-acetaminophen (NORCO/VICODIN) 5-325 MG per tablet Take 1 tablet by mouth every 4 (four) hours as needed. 11/08/14   Kristen N Ward, DO  ibuprofen (ADVIL,MOTRIN) 800 MG tablet Take 1 tablet (800 mg total) by mouth every 8 (eight) hours as needed for mild pain. 11/08/14   Kristen N Ward, DO  indomethacin (INDOCIN) 25 MG capsule Take 1 capsule (25 mg total) by mouth 3 (three) times daily as needed. 10/08/13   Rolan Bucco, MD  indomethacin (INDOCIN) 25 MG capsule Take 1 capsule (25 mg total) by mouth 3 (three) times daily as needed. 10/13/13   Elpidio Anis, PA-C  naproxen (NAPROSYN) 500 MG tablet Take 1 tablet (500 mg total) by mouth 2 (two)  times daily. 12/25/13   Courtney Forcucci, PA-C  penicillin v potassium (VEETID) 500 MG tablet Take 1 tablet (500 mg total) by mouth 4 (four) times daily. 11/08/14   Layla Maw Ward, DO  predniSONE (DELTASONE) 50 MG tablet One tablet PO daily for 4 days 07/20/13   Zadie Rhine, MD  predniSONE (DELTASONE) 50 MG tablet One tablet a day 09/22/13   Elson Areas, PA-C   BP 131/82 mmHg  Pulse 95  Temp(Src) 99 F (37.2 C) (Oral)  Resp 16  Ht 6' (1.829 m)  Wt 170 lb (77.111 kg)  BMI 23.05 kg/m2  SpO2 99% Physical Exam  Nursing note and vitals reviewed.  24 year old male, who appears uncomfortable, but is in no acute distress. Vital signs are normal. Oxygen saturation is 99%, which is normal. Head is normocephalic and atraumatic. PERRLA, EOMI. Moderate trismus is present. There is some moderate swelling of the right side of the lower jaw with that tenderness diffusely. There is no drooling and no pooling of secretions. He has limited voluntary movement of his jaw. No swelling of the sublingual tissues. Neck is nontender and supple without adenopathy or JVD. Back is nontender and there is no CVA tenderness. Lungs are clear without rales, wheezes, or rhonchi. Chest is nontender. Heart has regular rate and rhythm without murmur. Abdomen is soft, flat, nontender without masses or hepatosplenomegaly and peristalsis is normoactive. Extremities have no cyanosis  or edema, full range of motion is present. Skin is warm and dry without rash. Neurologic: Mental status is normal, cranial nerves are intact, there are no motor or sensory deficits.  ED Course  Procedures (including critical care time) Labs Review Results for orders placed or performed during the hospital encounter of 12/30/14  Basic metabolic panel  Result Value Ref Range   Sodium 134 (L) 135 - 145 mmol/L   Potassium 3.8 3.5 - 5.1 mmol/L   Chloride 101 101 - 111 mmol/L   CO2 25 22 - 32 mmol/L   Glucose, Bld 118 (H) 65 - 99 mg/dL   BUN 7  6 - 20 mg/dL   Creatinine, Ser 1.61 (L) 0.61 - 1.24 mg/dL   Calcium 9.0 8.9 - 09.6 mg/dL   GFR calc non Af Amer >60 >60 mL/min   GFR calc Af Amer >60 >60 mL/min   Anion gap 8 5 - 15  CBC with Differential  Result Value Ref Range   WBC 16.2 (H) 4.0 - 10.5 K/uL   RBC 5.36 4.22 - 5.81 MIL/uL   Hemoglobin 15.4 13.0 - 17.0 g/dL   HCT 04.5 40.9 - 81.1 %   MCV 80.8 78.0 - 100.0 fL   MCH 28.7 26.0 - 34.0 pg   MCHC 35.6 30.0 - 36.0 g/dL   RDW 91.4 78.2 - 95.6 %   Platelets 254 150 - 400 K/uL   Neutrophils Relative % 82 (H) 43 - 77 %   Neutro Abs 13.3 (H) 1.7 - 7.7 K/uL   Lymphocytes Relative 8 (L) 12 - 46 %   Lymphs Abs 1.3 0.7 - 4.0 K/uL   Monocytes Relative 10 3 - 12 %   Monocytes Absolute 1.6 (H) 0.1 - 1.0 K/uL   Eosinophils Relative 0 0 - 5 %   Eosinophils Absolute 0.0 0.0 - 0.7 K/uL   Basophils Relative 0 0 - 1 %   Basophils Absolute 0.0 0.0 - 0.1 K/uL   I have personally reviewed and evaluated these images and lab results as part of my medical decision-making.   MDM   Final diagnoses:  Periapical abscess with facial involvement    Dental abscess with facial swelling. Patient appears quite uncomfortable. Will send for CT to evaluate him. He is given IV fluids and IV clindamycin.  CT does appear to show an abscess. Final report is pending. Case signed out to Dr. Patria Mane.    Dione Booze, MD 12/30/14 [phone]

## 2014-12-30 NOTE — ED Notes (Signed)
Pt insistent on seeing doctor to get something for "Pain". Obtained order from EDP for ibuprofen until pt can be seen. Pt not satisfied, but took medication. Stated this is "not what I need, this hurts". Advised pt that the EDP would be with his as soon as possible due to high census in the ED, and to allow this medication time to work. Pt stated "what ever, this is bull, never mind".

## 2014-12-30 NOTE — Discharge Instructions (Signed)

## 2015-01-02 ENCOUNTER — Emergency Department (HOSPITAL_BASED_OUTPATIENT_CLINIC_OR_DEPARTMENT_OTHER)
Admission: EM | Admit: 2015-01-02 | Discharge: 2015-01-02 | Disposition: A | Discharge status: Home / Self Care | Payer: 59 | Attending: Emergency Medicine | Admitting: Emergency Medicine

## 2015-01-02 ENCOUNTER — Encounter (HOSPITAL_BASED_OUTPATIENT_CLINIC_OR_DEPARTMENT_OTHER): Payer: Self-pay

## 2015-01-02 DIAGNOSIS — Z792 Long term (current) use of antibiotics: Secondary | ICD-10-CM | POA: Insufficient documentation

## 2015-01-02 DIAGNOSIS — I889 Nonspecific lymphadenitis, unspecified: Secondary | ICD-10-CM

## 2015-01-02 DIAGNOSIS — Z791 Long term (current) use of non-steroidal anti-inflammatories (NSAID): Secondary | ICD-10-CM | POA: Insufficient documentation

## 2015-01-02 DIAGNOSIS — M109 Gout, unspecified: Secondary | ICD-10-CM | POA: Insufficient documentation

## 2015-01-02 DIAGNOSIS — B370 Candidal stomatitis: Secondary | ICD-10-CM | POA: Insufficient documentation

## 2015-01-02 DIAGNOSIS — Z720 Tobacco use: Secondary | ICD-10-CM | POA: Insufficient documentation

## 2015-01-02 DIAGNOSIS — K122 Cellulitis and abscess of mouth: Secondary | ICD-10-CM

## 2015-01-02 MED ORDER — HYDROCODONE-ACETAMINOPHEN 5-325 MG PO TABS: 1.0000 | ORAL_TABLET | Freq: Four times a day (QID) | ORAL | Status: DC | PRN

## 2015-01-02 MED ORDER — CEPHALEXIN 500 MG PO CAPS: 500.0000 mg | ORAL_CAPSULE | Freq: Four times a day (QID) | ORAL | Status: DC

## 2015-01-02 NOTE — Discharge Instructions (Signed)
Cervical Adenitis You have a swollen lymph gland in your neck. This commonly happens with Strep and virus infections, dental problems, insect bites, and injuries about the face, scalp, or neck. The lymph glands swell as the body fights the infection or heals the injury. Swelling and firmness typically lasts for several weeks after the infection or injury is healed. Rarely lymph glands can become swollen because of cancer or TB. Antibiotics are prescribed if there is evidence of an infection. Sometimes an infected lymph gland becomes filled with pus. This condition may require opening up the abscessed gland by draining it surgically. Most of the time infected glands return to normal within two weeks. Do not poke or squeeze the swollen lymph nodes. That may keep them from shrinking back to their normal size. If the lymph gland is still swollen after 2 weeks, further medical evaluation is needed.  SEEK IMMEDIATE MEDICAL CARE IF:  You have difficulty swallowing or breathing, increased swelling, severe pain, or a high fever.  Document Released: 04/04/2005 Document Revised: 06/27/2011 Document Reviewed: 09/24/2006 [facility] Patient Information 2015 [place], [place]. This information is not intended to replace advice given to you by your health care provider. Make sure you discuss any questions you have with your health care provider.   Emergency Department Resource Guide 1) Find a Doctor and Pay Out of Pocket Although you won't have to find out who is covered by your insurance plan, it is a good idea to ask around and get recommendations. You will then need to call the office and see if the doctor you have chosen will accept you as a new patient and what types of options they offer for patients who are self-pay. Some doctors offer discounts or will set up payment plans for their patients who do not have insurance, but you will need to ask so you aren't surprised when you get to your appointment.  2) Contact  Your Local Health Department Not all health departments have doctors that can see patients for sick visits, but many do, so it is worth a call to see if yours does. If you don't know where your local health department is, you can check in your phone book. The CDC also has a tool to help you locate your state's health department, and many state websites also have listings of all of their local health departments.  3) Find a Walk-in Clinic If your illness is not likely to be very severe or complicated, you may want to try a walk in clinic. These are popping up all over the country in pharmacies, drugstores, and shopping centers. They're usually staffed by nurse practitioners or physician assistants that have been trained to treat common illnesses and complaints. They're usually fairly quick and inexpensive. However, if you have serious medical issues or chronic medical problems, these are probably not your best option.  No Primary Care Doctor: - Call Health Connect at  [phone] - they can help you locate a primary care doctor that  accepts your insurance, provides certain services, etc. - Physician Referral Service- [phone]  Chronic Pain Problems: Organization         Address  Phone   Notes  [facility]  [phone] Patients need to be referred by their primary care doctor.   Medication Assistance: Organization         Address  Phone   Notes  [facility] [address] [place] [place], [place] [address] [phone] --Must  be a resident of [facility] -- Must have NO insurance coverage whatsoever (no Medicaid/ Medicare, etc.) -- The pt. MUST have a primary care doctor that directs their care regularly and follows them in the community   [facility]  [phone]   [employer]  (845)[phone]    Agencies that provide inexpensive medical care: Organization         Address  Phone   Notes  [facility]  [phone]   [facility]    [phone]   [employer] [address] [place], [place] [address] [phone]   [facility] [address] [place], [place] [phone]   Planned Parenthood    [phone]   [facility]    [phone]   [facility] and [facility]  [address], [place] Phone:  [phone], Fax:  [phone] Hours of Operation:  9 am - 6 pm, M-F.  Also accepts Medicaid/Medicare and self-pay.  [facility]  [address], [place] Phone: [phone], Fax: [phone]. Hours of Operation:  8:30 am - 5:30 pm, M-F.  Also accepts Medicaid and self-pay.  [facility] [place] [address] [place] Phone: [phone]   [facility] [address] Natasha Bence [place], [place] [phone], Ext. [phone] Mondays & Thursdays: 7-9 AM.  First 15 patients are seen on a first come, first serve basis.    Medicaid-accepting [facility] Providers:  Organization         Address  Phone   Notes  [facility] [address], [place] [phone] Also accepts self-pay patients.  [facility] [address] Laurell Josephs [place], [place]  [phone]   [facility] [address], [place] [phone]   [facility] [address], [place] [phone]   Renaye Rakers [address], [place]   [phone] Only accepts [place] Access [place] patients after they have their name applied to their card.   Self-Pay (no insurance) in [facility]:  Organization         Address  Phone   Notes  Sickle Cell Patients, [facility] [address] [place], [place] [phone]   [facility] [address] [place], [place] [phone]   [facility] [place]  [address], [place] [phone]   [facility]/Dr. Osei-Bonsu  [address] [place] or [address], [place] [phone] Phone number for both [place] and [place] locations is the same.  [facility] and [facility] [address] [place] [phone]   [facility] [address] [place] or [address] [phone]   [facility] [address] [place] [phone], phone; [phone], fax Sees patients 1st and 3rd Saturday of every month.  Must not qualify for public or private insurance (i.e. Medicaid, Medicare, [place] Health Choice, Veterans' Benefits)  Household income should be no more than 200% of the poverty level The clinic cannot treat you if you are pregnant or think you are pregnant  Sexually transmitted diseases are not treated at the clinic.    Dental Care: Organization  Address  Phone  Notes  [facility] [address] [place], [place] [phone] Accepts children up to age 50 who are enrolled in [place] or [place] Health Choice; pregnant women with a Medicaid card; and children who have applied for Medicaid or [place] Health Choice, but were declined, whose parents can pay a reduced fee at time of service.  [facility] Department of [facility]  [address], [place] [phone] Accepts children up to age 66 who are enrolled in [place] or [place] Health Choice; pregnant women with a Medicaid card; and children who have applied for Medicaid or  Health Choice, but were declined, whose parents can pay a reduced fee at time of service.  [facility]  [address] [place], [place] [phone] Patients are seen by appointment only. Walk-ins are not accepted. [facility] will see patients 51 years of age and older. Monday - Tuesday (8am-5pm) Most  Wednesdays (8:30-5pm) $30 per visit, cash only  [facility]  [address], [facility] [phone] Patients are seen by appointment only. Walk-ins are not accepted. [facility] will see patients 64 years of age and older. One Wednesday Evening (Monthly: Volunteer Based).  $30 per visit, cash only  [employer]  [phone] for adults; Children under age 72, call Graduate Pediatric Dentistry at [phone]. Children aged 34-14, please call [phone] to request a pediatric application.  Dental services are provided in all areas of dental care including fillings, crowns and bridges, complete and partial dentures, implants, gum treatment, root canals, and extractions. Preventive care is also provided. Treatment is provided to both adults and children. Patients are selected via a lottery and there is often a waiting list.   [facility] [address], [place]  [phone] [email or web address]   [facility] [address] [place], [place] [phone], Ext. [phone] Second and Fourth Thursday of each month, opens at 6:30 AM; Clinic ends at 9 AM.  Patients are seen on a first-come first-served basis, and a limited number are seen during each clinic.   [facility]  [address] Ether Griffins [place], [place] [phone]   Eligibility Requirements You must have lived in [place], [place], or [place] counties for at least the last three months.   You cannot be eligible for state or federal sponsored [place], including [employer], [place], or [employer].   You generally cannot be eligible for healthcare insurance through your employer.    How to apply: Eligibility screenings are held every Tuesday and Wednesday afternoon from 1:00 pm until 4:00 pm. You do not need an appointment for the interview!  [facility] [address], [place], [place] [phone]     [facility] Health Department  [phone]   [facility] Health Department  [phone]   [facility] Health Department  [phone]    Behavioral Health Resources in the Community: Intensive Outpatient Programs Organization         Address  Phone  Notes  [facility] [address], [place], [place] [phone]   [facility] [address], [place], [place] [phone]   [facility] [address], [place], [place]  [phone]   [facility] [address],  [place], [place] [phone] or [phone]   Substance Abuse  Resources Organization         Address  Phone  Notes  [facility]  ([phone]   [facility]  ([phone]   [facility] [place]  [phone]   Floydene Flock  [phone]   Residential & Outpatient Substance Abuse Program  ([phone]   Psychological Services Organization         Address  Phone  Notes  [facility] Behavioral Health  [phone]   [facility] Services  [phone]   [facility] Mental Health [address], [place] [phone] or ([phone]    Mobile Crisis Teams Organization         Address  Phone  Notes  [facility], Mobile Crisis Care Unit  [phone]   [facility]  [address] [place], [place] [phone]   Doristine Locks [address] [place] [phone]    Self-Help/Support Groups Organization         Address  Phone             Notes  [facility] [place] - variety of support groups  336- I7437963 Call for more information  Narcotics Anonymous (NA), [facility] [address], [employer] [place]  2 meetings at this location   Statistician         Address  Phone  Notes  [facility] [address],    [place]  [phone]   [facility]  [address] [place]  [address], [place], [place] [phone]   [facility] [address] [place], [place] [phone] Admissions: 8am-3pm M-F  [facility] [address]    [place], [place] [phone]   [facility] [address] [place], [place], [place] [phone]   [facility] [address]  [place], [place] [phone]   [facility] - Intensive Outpatient [address] Laurell Josephs [address], [place], [place] [phone]   [facility] ([facility] [address] [place]  [place], [place] [phone] or ([phone]) [phone]   [facility] ([facility]) [address], [place], [place] [phone] Accepts Medicaid  [facility] [address],  [place] [phone] Substance Abuse/Addiction Treatment   [facility] Organization         Address  Phone  Notes  [facility]  ([phone]   Angie Fava, PhD [address] Ervin Knack [place], [place]   ([phone] or [phone]   [facility] Behavioral   [address] [place], [place] [phone]   [facility] [address] [place], [place] [phone] Insurance/Medicaid/sponsorship through [facility] and Families [address]                                    [place], [place] [phone] Therapy/tele-psych/case  [facility] [address], [place] [phone]    Dr. Lolly Mustache  [phone]   [facility]  [employer] [facility] Dept. 1) [address] [place] 2) [address], [place] 3)  [address], [place] [phone]  [phone]   [facility] Child Abuse Hotline [phone] or [phone] (After Hours)

## 2015-01-02 NOTE — ED Notes (Signed)
C/o swelling to right lower inside of mouth and jaw-was seen at [facility] 9/13 for same

## 2015-01-02 NOTE — ED Provider Notes (Signed)
CSN: [id number]     Arrival date & time 01/02/15  1846 History  This chart was scribe for Elwin Mocha, MD by Angelene Giovanni, ED Scribe. The patient was seen in room MH06/MH06 and the patient's care was started at 8:33 PM.    Chief Complaint  Patient presents with  . Oral Swelling   HPI Comments: Seen 3 days ago for similar symptoms. CT showed tonsillar swelling with some right facial swelling also. No discrete abscess. Was put on clindamycin which he has been taking  Patient is a 24 y.o. male presenting with mouth injury. The history is provided by the patient. No language interpreter was used.  Mouth Injury This is a new problem. The current episode started more than 2 days ago. The problem occurs constantly. The problem has been gradually worsening. Pertinent negatives include no chest pain. Nothing aggravates the symptoms. Nothing relieves the symptoms. He has tried nothing for the symptoms.   HPI Comments: Tajuan Dufault is a 24 y.o. male who presents to the Emergency Department complaining of moderate gradually worsening pain in the inside of his right lower mouth onset 3 days ago. He states that something "popped" underneath his tongue with a smelly bloody drainage. He also complains of right lower jaw swelling. He denies any teeth pain or ear pain. He reports that he was given Clindamycin and Tylenol when he was seen 3 days ago but reports that his symptoms are worsening.  No PCP  Past Medical History  Diagnosis Date  . Gout    Past Surgical History  Procedure Laterality Date  . Toe surgery     No family history on file. Social History  Substance Use Topics  . Smoking status: Current Every Day Smoker    Types: Cigarettes  . Smokeless tobacco: None  . Alcohol Use: No    Review of Systems  Constitutional: Negative for fever.  HENT: Positive for mouth sores (mild under tongue). Negative for dental problem.   Cardiovascular: Negative for chest pain.  All other systems  reviewed and are negative.     Allergies  Review of patient's allergies indicates no known allergies.  Home Medications   Prior to Admission medications   Medication Sig Start Date End Date Taking? Authorizing Provider  clindamycin (CLEOCIN) 300 MG capsule Take 1 capsule (300 mg total) by mouth 3 (three) times daily. 12/30/14   Azalia Bilis, MD  ibuprofen (ADVIL,MOTRIN) 800 MG tablet Take 1 tablet (800 mg total) by mouth 3 (three) times daily. 12/30/14   Azalia Bilis, MD   BP 137/77 mmHg  Pulse 82  Temp(Src) 98.3 F (36.8 C) (Oral)  Resp 18  Ht 6' (1.829 m)  Wt 170 lb (77.111 kg)  BMI 23.05 kg/m2  SpO2 100% Physical Exam  Constitutional: He is oriented to person, place, and time. He appears well-developed and well-nourished. No distress.  HENT:  Head: Normocephalic and atraumatic.  Mouth/Throat: Oropharynx is clear and moist. No oropharyngeal exudate.  Floor of the mouth is normal  Eyes: EOM are normal. Pupils are equal, round, and reactive to light.  Neck: Normal range of motion. Neck supple.    Cardiovascular: Normal rate and regular rhythm.  Exam reveals no friction rub.   No murmur heard. Pulmonary/Chest: Effort normal and breath sounds normal. No respiratory distress. He has no wheezes. He has no rales.  Abdominal: He exhibits no distension. There is no tenderness. There is no rebound.  Musculoskeletal: Normal range of motion. He exhibits no edema.  Neurological: He  is alert and oriented to person, place, and time.  Skin: He is not diaphoretic.  Nursing note and vitals reviewed.   ED Course  Procedures (including critical care time) DIAGNOSTIC STUDIES: Oxygen Saturation is 100% on RA, normal by my interpretation.    COORDINATION OF CARE: 8:38 PM- Pt advised of plan for treatment and pt agrees.    Labs Review Labs Reviewed - No data to display  Imaging Review No results found. Elwin Mocha, MD has personally reviewed and evaluated these images and lab  results as part of my medical decision-making.   EKG Interpretation None      MDM   Final diagnoses:  None    24 year old male here with jaw pain. He was seen a few days ago and put on clindamycin for lower jaw swelling. Here he has right submandibular lymphadenopathy. He has a soft submental space. For the mouth is normal and not elevated. He has no trismus, no stridor, no difficulty breathing. I feel like he has very painful and tender submandibular lymphadenitis. Will add Keflex his clindamycin to cover for any dental sources. I do not see any intraoral abscess and no signs concerning for Ludwig's angina.  I personally performed the services described in this documentation, which was scribed in my presence. The recorded information has been reviewed and is accurate.     Elwin Mocha, MD 01/03/15 2351

## 2018-04-10 ENCOUNTER — Emergency Department (HOSPITAL_BASED_OUTPATIENT_CLINIC_OR_DEPARTMENT_OTHER): Payer: Self-pay

## 2018-04-10 ENCOUNTER — Encounter (HOSPITAL_BASED_OUTPATIENT_CLINIC_OR_DEPARTMENT_OTHER): Payer: Self-pay | Admitting: Emergency Medicine

## 2018-04-10 ENCOUNTER — Other Ambulatory Visit: Payer: Self-pay

## 2018-04-10 DIAGNOSIS — L03011 Cellulitis of right finger: Secondary | ICD-10-CM | POA: Insufficient documentation

## 2018-04-10 DIAGNOSIS — F17210 Nicotine dependence, cigarettes, uncomplicated: Secondary | ICD-10-CM | POA: Insufficient documentation

## 2018-04-10 MED ORDER — IBUPROFEN 400 MG PO TABS
400.0000 mg | ORAL_TABLET | Freq: Once | ORAL | Status: AC | PRN
  Administered 2018-04-10: 400 mg via ORAL
  Filled 2018-04-10: qty 1

## 2018-04-10 NOTE — ED Triage Notes (Signed)
Pt c/o 10/10 right index finger pain for the past 2 days some swollen and discoloration noticed on triage, pt denies any injury.

## 2018-04-11 ENCOUNTER — Emergency Department (HOSPITAL_BASED_OUTPATIENT_CLINIC_OR_DEPARTMENT_OTHER)
Admission: EM | Admit: 2018-04-11 | Discharge: 2018-04-11 | Disposition: A | Discharge status: Home / Self Care | Payer: Self-pay | Attending: Emergency Medicine | Admitting: Emergency Medicine

## 2018-04-11 DIAGNOSIS — L03011 Cellulitis of right finger: Secondary | ICD-10-CM

## 2018-04-11 MED ORDER — BUPIVACAINE HCL 0.5 % IJ SOLN
50.0000 mL | Freq: Once | INTRAMUSCULAR | Status: AC
  Administered 2018-04-11: 1 mL
  Filled 2018-04-11: qty 1

## 2018-04-11 MED ORDER — VANCOMYCIN HCL IN DEXTROSE 1-5 GM/200ML-% IV SOLN
1000.0000 mg | Freq: Once | INTRAVENOUS | Status: AC
Start: 2018-04-11 — End: 2018-04-11
  Administered 2018-04-11: 1000 mg via INTRAVENOUS
  Filled 2018-04-11: qty 200

## 2018-04-11 MED ORDER — DOXYCYCLINE HYCLATE 100 MG PO CAPS: 100.0000 mg | ORAL_CAPSULE | Freq: Two times a day (BID) | ORAL | 0 refills | Status: DC

## 2018-04-11 MED ORDER — ONDANSETRON HCL 4 MG/2ML IJ SOLN
4.0000 mg | Freq: Once | INTRAMUSCULAR | Status: AC
  Administered 2018-04-11: 4 mg via INTRAVENOUS
  Filled 2018-04-11: qty 2

## 2018-04-11 MED ORDER — HYDROMORPHONE HCL 1 MG/ML IJ SOLN
1.0000 mg | Freq: Once | INTRAMUSCULAR | Status: AC
  Administered 2018-04-11: 1 mg via INTRAVENOUS
  Filled 2018-04-11: qty 1

## 2018-04-11 MED ORDER — HYDROCODONE-ACETAMINOPHEN 5-325 MG PO TABS: 1.0000 | ORAL_TABLET | ORAL | 0 refills | Status: AC | PRN

## 2018-04-11 NOTE — ED Provider Notes (Signed)
[facility] Provider Note: Lowella DellJ. Lane Muskaan Smet, MD, FACEP  CSN: [id number] MRN: [id number] ARRIVAL: 04/10/18 at 2305 ROOM: MH02/MH02   CHIEF COMPLAINT  Hand Pain   HISTORY OF PRESENT ILLNESS  04/11/18 12:47 AM Ian Morrison is a 27 y.o. male with a 2-day history of pain, swelling and discoloration around the nail of his left index finger.  He rates his pain as a 10 out of 10, worse with palpation.  He denies any injury causing this.  He has not had a fever.   Past Medical History:  Diagnosis Date  . Gout     Past Surgical History:  Procedure Laterality Date  . TOE SURGERY      No family history on file.  Social History   Tobacco Use  . Smoking status: Current Every Day Smoker    Types: Cigarettes  Substance Use Topics  . Alcohol use: No  . Drug use: No    Prior to Admission medications   Medication Sig Start Date End Date Taking? Authorizing Provider  cephALEXin (KEFLEX) 500 MG capsule Take 1 capsule (500 mg total) by mouth 4 (four) times daily. 01/02/15   Elwin MochaWalden, Blair, MD  clindamycin (CLEOCIN) 300 MG capsule Take 1 capsule (300 mg total) by mouth 3 (three) times daily. 12/30/14   Azalia Bilisampos, Kevin, MD  HYDROcodone-acetaminophen (NORCO/VICODIN) 5-325 MG per tablet Take 1 tablet by mouth every 6 (six) hours as needed for moderate pain. 01/02/15   Elwin MochaWalden, Blair, MD  ibuprofen (ADVIL,MOTRIN) 800 MG tablet Take 1 tablet (800 mg total) by mouth 3 (three) times daily. 12/30/14   Azalia Bilisampos, Kevin, MD  colchicine 0.6 MG tablet Take 1 tablet (0.6 mg total) by mouth daily. If still having severe pain, may repeat in 3 days with 2 tablets (1.2mg ), followed by 1 tablet 1 hour later. 01/24/13 05/19/13  Toy Cookeyocherty, Megan, MD    Allergies Patient has no known allergies.   REVIEW OF SYSTEMS  Negative except as noted here or in the History of Present Illness.   PHYSICAL EXAMINATION  Initial Vital Signs Blood pressure (!) 134/92, pulse (!) 109, temperature 98.3 F (36.8 C),  temperature source Oral, resp. rate 18, height 6' (1.829 m), weight 72.6 kg, SpO2 99 %.  Examination General: Well-developed, well-nourished male in no acute distress; appearance consistent with age of record HENT: normocephalic; atraumatic Eyes: Normal appearance Neck: supple Heart: regular rate and rhythm Lungs: clear to auscultation bilaterally Abdomen: soft; nondistended Extremities: No deformity; swelling, discoloration and pointing abscess around nail of left index finger, no felon present:    Neurologic: Awake, alert and oriented; motor function intact in all extremities and symmetric; no facial droop Skin: Warm and dry Psychiatric: Flat affect   RESULTS  Summary of this visit's results, reviewed by myself:   EKG Interpretation  Date/Time:    Ventricular Rate:    PR Interval:    QRS Duration:   QT Interval:    QTC Calculation:   R Axis:     Text Interpretation:        Laboratory Studies: No results found for this or any previous visit (from the past 24 hour(s)). Imaging Studies: Dg Hand Complete Right  Result Date: 04/10/2018 CLINICAL DATA:  Second finger infection EXAM: RIGHT HAND - COMPLETE 3+ VIEW COMPARISON:  04/02/2012 FINDINGS: Generalized soft tissue swelling in the second digit is noted consistent with the given clinical history. No radiopaque foreign body is noted. No bony erosion or fracture is seen. No other focal abnormality is noted.  IMPRESSION: Generalized soft tissue swelling in the second digit without acute bony abnormality. Electronically Signed   By: Alcide CleverMark  Lukens M.D.   On: 04/10/2018 23:36    ED COURSE and MDM  Nursing notes and initial vitals signs, including pulse oximetry, reviewed.  Vitals:   04/10/18 2315 04/10/18 2316 04/11/18 0145  BP: (!) 134/92  132/76  Pulse: (!) 109  100  Resp: 18  16  Temp: 98.3 F (36.8 C)    TempSrc: Oral    SpO2: 99%  100%  Weight:  72.6 kg   Height:  6' (1.829 m)    Patient given vancomycin 1 g IV  for extensive paronychia.  Patient was advised his nail may fall off as the nail bed was likely involved.  If this occurs he will likely grow a new nail.    [facility] [place] reveals the patient has received no opioid prescriptions in the past 2 years.  PROCEDURES   INCISION AND DRAINAGE Performed by: Carlisle BeersJohn L Zaydn Gutridge Consent: Verbal consent obtained. Risks and benefits: risks, benefits and alternatives were discussed Type: Paronychia  Body area: Right index finger  Anesthesia: local infiltration  Local anesthetic: Bupivacaine 0.5 % without epinephrine  Anesthetic total: 4 ml  Incision was made with a scalpel.  Complexity: complex Blunt dissection to break up loculations  Drainage: purulent  Drainage amount: Copious  Packing material: None  Patient tolerance: Patient tolerated the procedure well with no immediate complications.   ED DIAGNOSES     ICD-10-CM   1. Paronychia of right index finger L03.011        Cathan Gearin, Jonny RuizJohn, MD 04/11/18 [phone]

## 2018-04-11 NOTE — ED Notes (Signed)
ED Provider at bedside. 

## 2018-05-17 ENCOUNTER — Encounter (HOSPITAL_BASED_OUTPATIENT_CLINIC_OR_DEPARTMENT_OTHER): Payer: Self-pay

## 2018-05-17 ENCOUNTER — Emergency Department (HOSPITAL_BASED_OUTPATIENT_CLINIC_OR_DEPARTMENT_OTHER)
Admission: EM | Admit: 2018-05-17 | Discharge: 2018-05-17 | Disposition: A | Discharge status: Home / Self Care | Payer: Self-pay | Attending: Emergency Medicine | Admitting: Emergency Medicine

## 2018-05-17 ENCOUNTER — Emergency Department (HOSPITAL_BASED_OUTPATIENT_CLINIC_OR_DEPARTMENT_OTHER): Payer: Self-pay

## 2018-05-17 ENCOUNTER — Other Ambulatory Visit: Payer: Self-pay

## 2018-05-17 DIAGNOSIS — L03116 Cellulitis of left lower limb: Secondary | ICD-10-CM | POA: Insufficient documentation

## 2018-05-17 DIAGNOSIS — F17210 Nicotine dependence, cigarettes, uncomplicated: Secondary | ICD-10-CM | POA: Insufficient documentation

## 2018-05-17 LAB — CBC WITH DIFFERENTIAL/PLATELET
Abs Immature Granulocytes: 0.04 10*3/uL (ref 0.00–0.07)
Basophils Absolute: 0.1 10*3/uL (ref 0.0–0.1)
Basophils Relative: 0 %
Eosinophils Absolute: 0.2 10*3/uL (ref 0.0–0.5)
Eosinophils Relative: 1 %
HEMATOCRIT: 42.4 % (ref 39.0–52.0)
Hemoglobin: 14 g/dL (ref 13.0–17.0)
IMMATURE GRANULOCYTES: 0 %
Lymphocytes Relative: 17 %
Lymphs Abs: 2.3 10*3/uL (ref 0.7–4.0)
MCH: 27.9 pg (ref 26.0–34.0)
MCHC: 33 g/dL (ref 30.0–36.0)
MCV: 84.5 fL (ref 80.0–100.0)
Monocytes Absolute: 0.9 10*3/uL (ref 0.1–1.0)
Monocytes Relative: 7 %
NRBC: 0 % (ref 0.0–0.2)
Neutro Abs: 10.3 10*3/uL — ABNORMAL HIGH (ref 1.7–7.7)
Neutrophils Relative %: 75 %
Platelets: 268 10*3/uL (ref 150–400)
RBC: 5.02 MIL/uL (ref 4.22–5.81)
RDW: 12.8 % (ref 11.5–15.5)
WBC: 13.8 10*3/uL — ABNORMAL HIGH (ref 4.0–10.5)

## 2018-05-17 LAB — BASIC METABOLIC PANEL
Anion gap: 7 (ref 5–15)
BUN: 21 mg/dL — ABNORMAL HIGH (ref 6–20)
CO2: 28 mmol/L (ref 22–32)
Calcium: 8.9 mg/dL (ref 8.9–10.3)
Chloride: 102 mmol/L (ref 98–111)
Creatinine, Ser: 0.78 mg/dL (ref 0.61–1.24)
GFR calc non Af Amer: >60 mL/min (ref >60)
Glucose, Bld: 118 mg/dL — ABNORMAL HIGH (ref 70–99)
Potassium: 3.4 mmol/L — ABNORMAL LOW (ref 3.5–5.1)
SODIUM: 137 mmol/L (ref 135–145)

## 2018-05-17 MED ORDER — DOXYCYCLINE HYCLATE 100 MG PO TABS
100.0000 mg | ORAL_TABLET | Freq: Once | ORAL | Status: AC
  Administered 2018-05-17: 100 mg via ORAL
  Filled 2018-05-17: qty 1

## 2018-05-17 MED ORDER — ACETAMINOPHEN 500 MG PO TABS
1000.0000 mg | ORAL_TABLET | Freq: Once | ORAL | Status: AC
  Administered 2018-05-17: 1000 mg via ORAL
  Filled 2018-05-17: qty 2

## 2018-05-17 MED ORDER — DOXYCYCLINE HYCLATE 100 MG PO CAPS: 100.0000 mg | ORAL_CAPSULE | Freq: Two times a day (BID) | ORAL | 0 refills | Status: AC

## 2018-05-17 MED ORDER — KETOROLAC TROMETHAMINE 30 MG/ML IJ SOLN
30.0000 mg | Freq: Once | INTRAMUSCULAR | Status: AC
  Administered 2018-05-17: 30 mg via INTRAVENOUS
  Filled 2018-05-17: qty 1

## 2018-05-17 NOTE — Discharge Instructions (Signed)
You can take Tylenol or Ibuprofen as directed for pain. You can alternate Tylenol and Ibuprofen every 4 hours. If you take Tylenol at 1pm, then you can take Ibuprofen at 5pm. Then you can take Tylenol again at 9pm.   Take antibiotics as directed. Please take all of your antibiotics until finished.  Follow-up with the [facility] wellness clinic for further evaluation or primary care.  Return to the emergency department for any worsening pain,, redness or swelling that extends beyond the line, high fevers despite medications or any other worsening or concerning symptoms.

## 2018-05-17 NOTE — ED Triage Notes (Signed)
C/o redness, swelling, blister area to left foot x 2 days-denies injury-NAD-steady gait

## 2018-05-17 NOTE — ED Provider Notes (Signed)
[facility] EMERGENCY DEPARTMENT Provider Note   CSN: [id number] Arrival date & time: 05/17/18  1047     History   Chief Complaint Chief Complaint  Patient presents with  . Foot Pain    HPI Ian Morrison is a 28 y.o. male presents for evaluation of 2 days of left foot pain, redness, swelling.  She reports that it is progressively worsened and started spreading up towards the dorsal aspect of his left foot.  He denies any preceding trauma, injury.  He noticed a scab area towards the proximal aspect of the third toe and thinks that something may have bit him but he is unsure.  He states he still been able to walk but reports that the pain is worse with walking.  He has not taken medications for the pain.  Patient states that he is on his feet a lot for work and wondered if that made it worse.  Patient states he has not had any fevers.  Patient denies any long travel, history of blood clots, recent surgeries, recent immobilization.  Patient denies any fevers, numbness/weakness.  The history is provided by the patient.    Past Medical History:  Diagnosis Date  . Gout     There are no active problems to display for this patient.   Past Surgical History:  Procedure Laterality Date  . TOE SURGERY          Home Medications    Prior to Admission medications   Medication Sig Start Date End Date Taking? Authorizing Provider  doxycycline (VIBRAMYCIN) 100 MG capsule Take 1 capsule (100 mg total) by mouth 2 (two) times daily. 05/17/18   Maxwell Caul, PA-C  HYDROcodone-acetaminophen (NORCO/VICODIN) 5-325 MG tablet Take 1 tablet by mouth every 4 (four) hours as needed (for pain). 04/11/18   Molpus, John, MD    Family History No family history on file.  Social History Social History   Tobacco Use  . Smoking status: Current Every Day Smoker    Types: Cigarettes  . Smokeless tobacco: Never Used  Substance Use Topics  . Alcohol use: No  . Drug use: No      Allergies   Patient has no known allergies.   Review of Systems Review of Systems  Constitutional: Negative for fever.  Cardiovascular: Positive for leg swelling.  Musculoskeletal:       LLE pain  Skin: Positive for color change and wound.  Neurological: Negative for weakness and numbness.  All other systems reviewed and are negative.    Physical Exam Updated Vital Signs BP 111/71   Pulse 69   Temp 97.8 F (36.6 C) (Oral)   Resp 18   Ht 6' (1.829 m)   Wt 77.1 kg   SpO2 99%   BMI 23.06 kg/m   Physical Exam Vitals signs and nursing note reviewed.  Constitutional:      Appearance: He is well-developed.  HENT:     Head: Normocephalic and atraumatic.  Eyes:     General: No scleral icterus.       Right eye: No discharge.        Left eye: No discharge.     Conjunctiva/sclera: Conjunctivae normal.  Cardiovascular:     Pulses:          Dorsalis pedis pulses are 2+ on the right side and 2+ on the left side.  Pulmonary:     Effort: Pulmonary effort is normal.  Musculoskeletal:     Comments: Tenderness noted to the  left calf.  No surrounding warmth, erythema, edema.  Tenderness noted to the dorsal aspect of the left foot that extends from just below the ankle joint distally over across the foot to the toes.  It does not extend past the ankle joint.  Flexion/extension intact.  Skin:    General: Skin is warm and dry.     Capillary Refill: Capillary refill takes less than 2 seconds.     Comments: Diffuse warmth, erythema noted to the dorsal aspect of the foot begins at the toes and extends to the dorsal aspect of foot just right to the ankle joint. Good distal cap refill.  LLE is not dusky in appearance or cool to touch.  Small 0.5 cm scab.  No surrounding fluctuance.  Neurological:     Mental Status: He is alert.  Psychiatric:        Speech: Speech normal.        Behavior: Behavior normal.      ED Treatments / Results  Labs (all labs ordered are listed, but  only abnormal results are displayed) Labs Reviewed  BASIC METABOLIC PANEL - Abnormal; Notable for the following components:      Result Value   Potassium 3.4 (*)    Glucose, Bld 118 (*)    BUN 21 (*)    All other components within normal limits  CBC WITH DIFFERENTIAL/PLATELET - Abnormal; Notable for the following components:   WBC 13.8 (*)    Neutro Abs 10.3 (*)    All other components within normal limits    EKG None  Radiology [place] Venous Img Lower  Left (dvt Study)  Result Date: 05/17/2018 CLINICAL DATA:  Left lower extremity pain and edema. EXAM: LEFT LOWER EXTREMITY VENOUS DOPPLER ULTRASOUND TECHNIQUE: Gray-scale sonography with graded compression, as well as color Doppler and duplex ultrasound were performed to evaluate the lower extremity deep venous systems from the level of the common femoral vein and including the common femoral, femoral, profunda femoral, popliteal and calf veins including the posterior tibial, peroneal and gastrocnemius veins when visible. The superficial great saphenous vein was also interrogated. Spectral Doppler was utilized to evaluate flow at rest and with distal augmentation maneuvers in the common femoral, femoral and popliteal veins. COMPARISON:  None. FINDINGS: Contralateral Common Femoral Vein: Respiratory phasicity is normal and symmetric with the symptomatic side. No evidence of thrombus. Normal compressibility. Common Femoral Vein: No evidence of thrombus. Normal compressibility, respiratory phasicity and response to augmentation. Saphenofemoral Junction: No evidence of thrombus. Normal compressibility and flow on color Doppler imaging. Profunda Femoral Vein: No evidence of thrombus. Normal compressibility and flow on color Doppler imaging. Femoral Vein: No evidence of thrombus. Normal compressibility, respiratory phasicity and response to augmentation. Popliteal Vein: No evidence of thrombus. Normal compressibility, respiratory phasicity and response to  augmentation. Calf Veins: No evidence of thrombus. Normal compressibility and flow on color Doppler imaging. Superficial Great Saphenous Vein: No evidence of thrombus. Normal compressibility. Venous Reflux:  None. Other Findings: No evidence of superficial thrombophlebitis or abnormal fluid collection. IMPRESSION: No evidence of left lower extremity deep venous thrombosis. Electronically Signed   By: Irish LackGlenn  Yamagata M.D.   On: 05/17/2018 14:33    Procedures Procedures (including critical care time)  Medications Ordered in ED Medications  ketorolac (TORADOL) 30 MG/ML injection 30 mg (30 mg Intravenous Given 05/17/18 1305)  doxycycline (VIBRA-TABS) tablet 100 mg (100 mg Oral Given 05/17/18 1505)  acetaminophen (TYLENOL) tablet 1,000 mg (1,000 mg Oral Given 05/17/18 1505)  Initial Impression / Assessment and Plan / ED Course  I have reviewed the triage vital signs and the nursing notes.  Pertinent labs & imaging results that were available during my care of the patient were reviewed by me and considered in my medical decision making (see chart for details).     28 year old male who presents for evaluation of 2 days of warmth, redness, pain to his left foot.  He states that he did not have any injury.  He is not having fever.  He states it is worse when he walks on it.  Patient does not know if he was bitten by something. Patient is afebrile, non-toxic appearing, sitting comfortably on examination table. Vital signs reviewed and stable.  On exam, he has diffuse warmth, erythema, induration noted dorsal aspect of the foot that begins just proximal to the toes and extends distally over the dorsal aspect.  It does not overlie the ankle joint.  Patient does have some tenderness noted to the calf of left lower extremity.  He has a small scab wound noted just proximal to the third toe but no evidence of abscess.  Concern for cellulitis versus DVT.  History/physical exam not concerning for acute arterial  embolism, septic arthritis.  Will plan to check labs, ultrasound.  CBC shows leukocytosis of 13.8.  Otherwise unremarkable.  BMP unremarkable.  Ultrasound study reviewed.  No evidence of DVT.  Given leukocytosis and symptoms, will plan to treat as cellulitis.  Vital signs reviewed and stable.  Discussed results with patient.  He reports improvement in pain after analgesics here in the ED.  Skin was marked with a skin marker.  Will start patient on antibiotics.  Instructed patient to follow-up with primary care.  Instructed to return if the redness starts spreading up.  At this time, his vital signs are stable and he has no signs of systemic infection.  Patient stable for outpatient therapy. At this time, patient exhibits no emergent life-threatening condition that require further evaluation in ED or admission. Patient had ample opportunity for questions and discussion. All patient's questions were answered with full understanding. Strict return precautions discussed. Patient expresses understanding and agreement to plan.   Portions of this note were generated with Scientist, clinical (histocompatibility and immunogenetics). Dictation errors may occur despite best attempts at proofreading.  Final Clinical Impressions(s) / ED Diagnoses   Final diagnoses:  Cellulitis of foot, left    ED Discharge Orders         Ordered    doxycycline (VIBRAMYCIN) 100 MG capsule  2 times daily     05/17/18 1503           Rosana Hoes 05/17/18 1649    Little, Ambrose Finland, MD 05/17/18 [phone]

## 2018-09-03 ENCOUNTER — Encounter (HOSPITAL_COMMUNITY): Payer: Self-pay | Admitting: Emergency Medicine

## 2018-09-03 ENCOUNTER — Emergency Department (HOSPITAL_COMMUNITY): Payer: Self-pay

## 2018-09-03 ENCOUNTER — Emergency Department (HOSPITAL_COMMUNITY)
Admission: EM | Admit: 2018-09-03 | Discharge: 2018-09-03 | Disposition: A | Discharge status: Home / Self Care | Payer: Self-pay | Attending: Emergency Medicine | Admitting: Emergency Medicine

## 2018-09-03 ENCOUNTER — Other Ambulatory Visit: Payer: Self-pay

## 2018-09-03 DIAGNOSIS — Y999 Unspecified external cause status: Secondary | ICD-10-CM | POA: Insufficient documentation

## 2018-09-03 DIAGNOSIS — X58XXXA Exposure to other specified factors, initial encounter: Secondary | ICD-10-CM | POA: Insufficient documentation

## 2018-09-03 DIAGNOSIS — L089 Local infection of the skin and subcutaneous tissue, unspecified: Secondary | ICD-10-CM | POA: Insufficient documentation

## 2018-09-03 DIAGNOSIS — S60424A Blister (nonthermal) of right ring finger, initial encounter: Secondary | ICD-10-CM | POA: Insufficient documentation

## 2018-09-03 DIAGNOSIS — T148XXA Other injury of unspecified body region, initial encounter: Secondary | ICD-10-CM

## 2018-09-03 DIAGNOSIS — Y939 Activity, unspecified: Secondary | ICD-10-CM | POA: Insufficient documentation

## 2018-09-03 DIAGNOSIS — Y929 Unspecified place or not applicable: Secondary | ICD-10-CM | POA: Insufficient documentation

## 2018-09-03 DIAGNOSIS — F17210 Nicotine dependence, cigarettes, uncomplicated: Secondary | ICD-10-CM | POA: Insufficient documentation

## 2018-09-03 LAB — COMPREHENSIVE METABOLIC PANEL
ALT: 76 U/L — ABNORMAL HIGH (ref 0–44)
AST: 30 U/L (ref 15–41)
Albumin: 3.6 g/dL (ref 3.5–5.0)
Alkaline Phosphatase: 64 U/L (ref 38–126)
Anion gap: 9 (ref 5–15)
BUN: 14 mg/dL (ref 6–20)
CO2: 27 mmol/L (ref 22–32)
Calcium: 9 mg/dL (ref 8.9–10.3)
Chloride: 98 mmol/L (ref 98–111)
Creatinine, Ser: 0.74 mg/dL (ref 0.61–1.24)
GFR calc Af Amer: >60 mL/min (ref >60)
GFR calc non Af Amer: >60 mL/min (ref >60)
Glucose, Bld: 90 mg/dL (ref 70–99)
Potassium: 4.3 mmol/L (ref 3.5–5.1)
Sodium: 134 mmol/L — ABNORMAL LOW (ref 135–145)
Total Bilirubin: 0.6 mg/dL (ref 0.3–1.2)
Total Protein: 7.9 g/dL (ref 6.5–8.1)

## 2018-09-03 LAB — CBC
HCT: 49 % (ref 39.0–52.0)
Hemoglobin: 15.9 g/dL (ref 13.0–17.0)
MCH: 28.1 pg (ref 26.0–34.0)
MCHC: 32.4 g/dL (ref 30.0–36.0)
MCV: 86.6 fL (ref 80.0–100.0)
Platelets: 454 10*3/uL — ABNORMAL HIGH (ref 150–400)
RBC: 5.66 MIL/uL (ref 4.22–5.81)
RDW: 13.5 % (ref 11.5–15.5)
WBC: 18.7 10*3/uL — ABNORMAL HIGH (ref 4.0–10.5)
nRBC: 0 % (ref 0.0–0.2)

## 2018-09-03 MED ORDER — SULFAMETHOXAZOLE-TRIMETHOPRIM 800-160 MG PO TABS: 1.0000 | ORAL_TABLET | Freq: Two times a day (BID) | ORAL | 0 refills | Status: AC

## 2018-09-03 MED ORDER — VANCOMYCIN HCL 1.5 G IV SOLR
1500.0000 mg | Freq: Once | INTRAVENOUS | Status: AC
  Administered 2018-09-03: 1500 mg via INTRAVENOUS
  Filled 2018-09-03: qty 1500

## 2018-09-03 MED ORDER — SODIUM CHLORIDE 0.9 % IV SOLN
2.0000 g | Freq: Once | INTRAVENOUS | Status: AC
  Administered 2018-09-03: 2 g via INTRAVENOUS
  Filled 2018-09-03: qty 20

## 2018-09-03 MED ORDER — VANCOMYCIN HCL 10 G IV SOLR
1500.0000 mg | Freq: Once | INTRAVENOUS | Status: DC
  Filled 2018-09-03: qty 1500

## 2018-09-03 MED ORDER — IOHEXOL 300 MG/ML  SOLN
75.0000 mL | Freq: Once | INTRAMUSCULAR | Status: AC | PRN
  Administered 2018-09-03: 75 mL via INTRAVENOUS

## 2018-09-03 MED ORDER — KETOROLAC TROMETHAMINE 15 MG/ML IJ SOLN
15.0000 mg | Freq: Once | INTRAMUSCULAR | Status: AC
  Administered 2018-09-03: 15 mg via INTRAVENOUS
  Filled 2018-09-03: qty 1

## 2018-09-03 MED ORDER — CEPHALEXIN 500 MG PO CAPS: 500.0000 mg | ORAL_CAPSULE | Freq: Three times a day (TID) | ORAL | 0 refills | Status: AC

## 2018-09-03 NOTE — Discharge Instructions (Addendum)
You were evaluated in the Emergency Department and after careful evaluation, we did not find any emergent condition requiring admission or further testing in the hospital.  Your symptoms today seem to be due to blistering of the skin on top of the finger.  Your testing was otherwise reassuring.  Please soak the hand in water soapy water 3 times daily for at least 10 minutes each time.  Take the antibiotics provided as directed for the next week.  Please return to the Emergency Department if you experience any worsening of your condition.  We encourage you to follow up with a primary care provider.  Thank you for allowing [place] to be a part of your care.

## 2018-09-03 NOTE — ED Triage Notes (Signed)
[facility] ring finger. Pt's finger swollen and skin is blackened. Pt reports this has been going on for about a week. Denies known injury. Jail concerned about possible gangrene. Pt currently taking Keflex. Denies fever.

## 2018-09-03 NOTE — ED Provider Notes (Signed)
[facility] Emergency Department Provider Note MRN:  [id number]  Arrival date & time: 09/03/18     Chief Complaint   Hand Problem   History of Present Illness   Ian Morrison is a 28 y.o. year-old male with a history of gout, IV drug use presenting to the ED with chief complaint of right hand problem.  [facility] from jail with concerns for finger infection.  Pain and swelling for almost a week, has been on Keflex for 4 to 5 days without improvement.  Has continued to change color, no dark, extremely painful.  Pain and tenderness extends to the wrist and forearm.  Denies fever.  Last use IV drugs 2 weeks ago, denies chest pain or shortness of breath.  Denies abdominal pain, no other complaints.  Review of Systems  A complete 10 system review of systems was obtained and all systems are negative except as noted in the HPI and PMH.   Patient's Health History    Past Medical History:  Diagnosis Date  . Gout     Past Surgical History:  Procedure Laterality Date  . TOE SURGERY      No family history on file.  Social History   Socioeconomic History  . Marital status: Single    Spouse name: Not on file  . Number of children: Not on file  . Years of education: Not on file  . Highest education level: Not on file  Occupational History  . Not on file  Social Needs  . Financial resource strain: Not on file  . Food insecurity:    Worry: Not on file    Inability: Not on file  . Transportation needs:    Medical: Not on file    Non-medical: Not on file  Tobacco Use  . Smoking status: Current Every Day Smoker    Types: Cigarettes  . Smokeless tobacco: Never Used  Substance and Sexual Activity  . Alcohol use: No  . Drug use: No  . Sexual activity: Not on file  Lifestyle  . Physical activity:    Days per week: Not on file    Minutes per session: Not on file  . Stress: Not on file  Relationships  . Social connections:    Talks on phone: Not on file   Gets together: Not on file    Attends religious service: Not on file    Active member of club or organization: Not on file    Attends meetings of clubs or organizations: Not on file    Relationship status: Not on file  . Intimate partner violence:    Fear of current or ex partner: Not on file    Emotionally abused: Not on file    Physically abused: Not on file    Forced sexual activity: Not on file  Other Topics Concern  . Not on file  Social History Narrative  . Not on file     Physical Exam  Vital Signs and Nursing Notes reviewed Vitals:   09/03/18 1600 09/03/18 1630  BP: 120/75 121/70  Pulse: 72 73  Resp: 20 20  Temp:    SpO2: 100% 100%    CONSTITUTIONAL: Chronically ill-appearing, NAD NEURO:  Alert and oriented x 3, no focal deficits EYES:  eyes equal and reactive ENT/NECK:  no LAD, no JVD CARDIO: Regular rate, well-perfused, normal S1 and S2 PULM:  CTAB no wheezing or rhonchi GI/GU:  normal bowel sounds, non-distended, non-tender MSK/SPINE: Dorsal aspect of the right ring finger  with bruised discoloration and raised bulla, significantly tender to palpation, tenderness extends to the hand, wrist, forearm SKIN:  no rash, atraumatic PSYCH:  Appropriate speech and behavior  Diagnostic and Interventional Summary    EKG Interpretation  Date/Time:  Monday Sep 03 2018 15:44:48 EDT Ventricular Rate:  71 PR Interval:    QRS Duration: 99 QT Interval:  406 QTC Calculation: 442 R Axis:   113 Text Interpretation:  Sinus rhythm Consider right ventricular hypertrophy ST elevation suggests acute pericarditis Confirmed by Kennis Carina [phone]) on 09/03/2018 4:55:30 PM       Labs Reviewed  CBC - Abnormal; Notable for the following components:      Result Value   WBC 18.7 (*)    Platelets 454 (*)    All other components within normal limits  COMPREHENSIVE METABOLIC PANEL - Abnormal; Notable for the following components:   Sodium 134 (*)    ALT 76 (*)    All other  components within normal limits  CULTURE, BLOOD (SINGLE)    CT HAND RIGHT W CONTRAST  Final Result    CT WRIST RIGHT W CONTRAST  Final Result    CT FOREARM RIGHT W CONTRAST  Final Result      Medications  vancomycin (VANCOCIN) 1,500 mg in sodium chloride 0.9 % 500 mL IVPB (1,500 mg Intravenous New Bag/Given 09/03/18 1634)  cefTRIAXone (ROCEPHIN) 2 g in sodium chloride 0.9 % 100 mL IVPB (0 g Intravenous Stopped 09/03/18 1633)  ketorolac (TORADOL) 15 MG/ML injection 15 mg (15 mg Intravenous Given 09/03/18 1709)  iohexol (OMNIPAQUE) 300 MG/ML solution 75 mL (75 mLs Intravenous Contrast Given 09/03/18 1647)     Procedures Critical Care  ED Course and Medical Decision Making  I have reviewed the triage vital signs and the nursing notes.  Pertinent labs & imaging results that were available during my care of the patient were reviewed by me and considered in my medical decision making (see below for details).  Considering deep space hand infection, osteomyelitis, less likely necrotizing infection in this 28 year old male with evident finger abscess, tenderness extending to the forearm.  No fever, normal vital signs here.  CTA of the extremity is pending.  Will provide empiric vancomycin and ceftriaxone.  I did not hear a murmur on exam, last use IV drugs 2 weeks ago.  CT is actually quite reassuring with no evidence of infection.  No osteomyelitis, no signs of abscess.  Favoring superficial blistering of the skin.  Suspecting some type of trauma to the finger causing a blood blister, with the underlying blood causing the discoloration that was concerning to the healthcare providers in the jail.  Discussed pros and cons of drainage of the blister, given the absence of signs of infection on the CT, I advised soaking the hand in warm soapy water multiple times a day for the next week along with antibiotics.  After the discussed management above, the patient was determined to be safe for discharge.   The patient was in agreement with this plan and all questions regarding their care were answered.  ED return precautions were discussed and the patient will return to the ED with any significant worsening of condition.  Elmer Sow. Pilar Plate, MD [facility] Emergency Medicine [facility] [email or web address]  Final Clinical Impressions(s) / ED Diagnoses     ICD-10-CM   1. Blister T14.8XXA   2. Finger infection L08.9     ED Discharge Orders         Ordered  cephALEXin (KEFLEX) 500 MG capsule  3 times daily     09/03/18 1808    sulfamethoxazole-trimethoprim (BACTRIM DS) 800-160 MG tablet  2 times daily     09/03/18 1808             Sabas SousBero, Tenita Cue M, MD 09/03/18 1814

## 2018-09-08 LAB — CULTURE, BLOOD (SINGLE)
Culture: NO GROWTH
Special Requests: ADEQUATE

## 2018-10-13 ENCOUNTER — Emergency Department (HOSPITAL_COMMUNITY): Payer: Self-pay

## 2018-10-13 ENCOUNTER — Observation Stay (HOSPITAL_COMMUNITY)
Admission: EM | Admit: 2018-10-13 | Discharge: 2018-10-13 | Discharge status: Against Medical Advice | Payer: Self-pay | Attending: Family Medicine | Admitting: Family Medicine

## 2018-10-13 DIAGNOSIS — G928 Other toxic encephalopathy: Secondary | ICD-10-CM | POA: Diagnosis present

## 2018-10-13 DIAGNOSIS — G92 Toxic encephalopathy: Secondary | ICD-10-CM | POA: Insufficient documentation

## 2018-10-13 DIAGNOSIS — R7302 Impaired glucose tolerance (oral): Secondary | ICD-10-CM | POA: Insufficient documentation

## 2018-10-13 DIAGNOSIS — Z1159 Encounter for screening for other viral diseases: Secondary | ICD-10-CM | POA: Insufficient documentation

## 2018-10-13 DIAGNOSIS — F1910 Other psychoactive substance abuse, uncomplicated: Secondary | ICD-10-CM | POA: Insufficient documentation

## 2018-10-13 DIAGNOSIS — G9341 Metabolic encephalopathy: Secondary | ICD-10-CM

## 2018-10-13 DIAGNOSIS — Y9259 Other trade areas as the place of occurrence of the external cause: Secondary | ICD-10-CM | POA: Insufficient documentation

## 2018-10-13 DIAGNOSIS — G9340 Encephalopathy, unspecified: Secondary | ICD-10-CM

## 2018-10-13 DIAGNOSIS — D72829 Elevated white blood cell count, unspecified: Secondary | ICD-10-CM | POA: Insufficient documentation

## 2018-10-13 DIAGNOSIS — T50901A Poisoning by unspecified drugs, medicaments and biological substances, accidental (unintentional), initial encounter: Secondary | ICD-10-CM

## 2018-10-13 DIAGNOSIS — R7989 Other specified abnormal findings of blood chemistry: Secondary | ICD-10-CM | POA: Insufficient documentation

## 2018-10-13 DIAGNOSIS — T40601A Poisoning by unspecified narcotics, accidental (unintentional), initial encounter: Secondary | ICD-10-CM | POA: Insufficient documentation

## 2018-10-13 DIAGNOSIS — T405X1A Poisoning by cocaine, accidental (unintentional), initial encounter: Principal | ICD-10-CM | POA: Insufficient documentation

## 2018-10-13 LAB — CBC WITH DIFFERENTIAL/PLATELET
Abs Immature Granulocytes: 0.33 10*3/uL — ABNORMAL HIGH (ref 0.00–0.07)
Basophils Absolute: 0.1 10*3/uL (ref 0.0–0.1)
Basophils Relative: 0 %
Eosinophils Absolute: 0 10*3/uL (ref 0.0–0.5)
Eosinophils Relative: 0 %
HCT: 46.5 % (ref 39.0–52.0)
Hemoglobin: 15 g/dL (ref 13.0–17.0)
Immature Granulocytes: 1 %
Lymphocytes Relative: 4 %
Lymphs Abs: 1.1 10*3/uL (ref 0.7–4.0)
MCH: 28.3 pg (ref 26.0–34.0)
MCHC: 32.3 g/dL (ref 30.0–36.0)
MCV: 87.7 fL (ref 80.0–100.0)
Monocytes Absolute: 0.5 10*3/uL (ref 0.1–1.0)
Monocytes Relative: 2 %
Neutro Abs: 24.3 10*3/uL — ABNORMAL HIGH (ref 1.7–7.7)
Neutrophils Relative %: 93 %
Platelets: 428 10*3/uL — ABNORMAL HIGH (ref 150–400)
RBC: 5.3 MIL/uL (ref 4.22–5.81)
RDW: 14.4 % (ref 11.5–15.5)
WBC Morphology: INCREASED
WBC: 26.3 10*3/uL — ABNORMAL HIGH (ref 4.0–10.5)
nRBC: 0 % (ref 0.0–0.2)

## 2018-10-13 LAB — URINALYSIS, ROUTINE W REFLEX MICROSCOPIC
Bilirubin Urine: NEGATIVE
Glucose, UA: >=500 mg/dL — AB
Hgb urine dipstick: NEGATIVE
Ketones, ur: NEGATIVE mg/dL
Leukocytes,Ua: NEGATIVE
Nitrite: NEGATIVE
Protein, ur: 100 mg/dL — AB
Specific Gravity, Urine: 1.016 (ref 1.005–1.030)
pH: 6 (ref 5.0–8.0)

## 2018-10-13 LAB — CBG MONITORING, ED
Glucose-Capillary: 394 mg/dL — ABNORMAL HIGH (ref 70–99)
Glucose-Capillary: 89 mg/dL (ref 70–99)

## 2018-10-13 LAB — HEMOGLOBIN A1C
Hgb A1c MFr Bld: 5.5 % (ref 4.8–5.6)
Mean Plasma Glucose: 111.15 mg/dL

## 2018-10-13 LAB — ETHANOL: Alcohol, Ethyl (B): <10 mg/dL (ref <10)

## 2018-10-13 LAB — RAPID URINE DRUG SCREEN, HOSP PERFORMED
Amphetamines: POSITIVE — AB
Barbiturates: NOT DETECTED
Benzodiazepines: NOT DETECTED
Cocaine: POSITIVE — AB
Opiates: NOT DETECTED
Tetrahydrocannabinol: POSITIVE — AB

## 2018-10-13 LAB — MAGNESIUM: Magnesium: 2.4 mg/dL (ref 1.7–2.4)

## 2018-10-13 MED ORDER — HEPARIN SODIUM (PORCINE) 5000 UNIT/ML IJ SOLN: 5000.0000 [IU] | Freq: Three times a day (TID) | INTRAMUSCULAR | Status: DC

## 2018-10-13 MED ORDER — PHENOBARBITAL 32.4 MG PO TABS: 32.4000 mg | ORAL_TABLET | Freq: Two times a day (BID) | ORAL | Status: DC

## 2018-10-13 MED ORDER — HYDROXYZINE HCL 25 MG PO TABS: 25.0000 mg | ORAL_TABLET | Freq: Two times a day (BID) | ORAL | Status: DC

## 2018-10-13 MED ORDER — SODIUM CHLORIDE 0.9% FLUSH: 3.0000 mL | INTRAVENOUS | Status: DC | PRN

## 2018-10-13 MED ORDER — GABAPENTIN 300 MG PO CAPS: 300.0000 mg | ORAL_CAPSULE | Freq: Three times a day (TID) | ORAL | Status: DC

## 2018-10-13 MED ORDER — SODIUM CHLORIDE 0.9 % IV SOLN
INTRAVENOUS | Status: DC
  Administered 2018-10-13: 16:00:00 via INTRAVENOUS

## 2018-10-13 MED ORDER — ACETAMINOPHEN 325 MG PO TABS: 650.0000 mg | ORAL_TABLET | Freq: Four times a day (QID) | ORAL | Status: DC | PRN

## 2018-10-13 MED ORDER — PHENOBARBITAL 32.4 MG PO TABS: 32.4000 mg | ORAL_TABLET | ORAL | Status: DC

## 2018-10-13 MED ORDER — PHENOBARBITAL 30 MG PO TABS: 130.0000 mg | ORAL_TABLET | Freq: Once | ORAL | Status: DC

## 2018-10-13 MED ORDER — ONDANSETRON HCL 4 MG/2ML IJ SOLN: 4.0000 mg | Freq: Four times a day (QID) | INTRAMUSCULAR | Status: DC | PRN

## 2018-10-13 MED ORDER — PHENOBARBITAL 32.4 MG PO TABS: 32.4000 mg | ORAL_TABLET | Freq: Three times a day (TID) | ORAL | Status: DC

## 2018-10-13 MED ORDER — POLYETHYLENE GLYCOL 3350 17 G PO PACK: 17.0000 g | PACK | Freq: Every day | ORAL | Status: DC | PRN

## 2018-10-13 MED ORDER — ALBUTEROL SULFATE (2.5 MG/3ML) 0.083% IN NEBU: 2.5000 mg | INHALATION_SOLUTION | RESPIRATORY_TRACT | Status: DC | PRN

## 2018-10-13 MED ORDER — PHENOBARBITAL 32.4 MG PO TABS: 64.8000 mg | ORAL_TABLET | Freq: Three times a day (TID) | ORAL | Status: DC

## 2018-10-13 MED ORDER — SODIUM CHLORIDE 0.9 % IV SOLN: 250.0000 mL | INTRAVENOUS | Status: DC | PRN

## 2018-10-13 MED ORDER — PANTOPRAZOLE SODIUM 40 MG PO TBEC: 40.0000 mg | DELAYED_RELEASE_TABLET | Freq: Every day | ORAL | Status: DC

## 2018-10-13 MED ORDER — ACETAMINOPHEN 650 MG RE SUPP: 650.0000 mg | Freq: Four times a day (QID) | RECTAL | Status: DC | PRN

## 2018-10-13 MED ORDER — SENNA 8.6 MG PO TABS
2.0000 | ORAL_TABLET | Freq: Two times a day (BID) | ORAL | Status: DC
  Filled 2018-10-13 (×5): qty 2

## 2018-10-13 MED ORDER — TRAZODONE HCL 50 MG PO TABS: 100.0000 mg | ORAL_TABLET | Freq: Every day | ORAL | Status: DC

## 2018-10-13 MED ORDER — PHENOBARBITAL 32.4 MG PO TABS: 16.2000 mg | ORAL_TABLET | Freq: Two times a day (BID) | ORAL | Status: DC

## 2018-10-13 MED ORDER — ONDANSETRON HCL 4 MG PO TABS: 4.0000 mg | ORAL_TABLET | Freq: Four times a day (QID) | ORAL | Status: DC | PRN

## 2018-10-13 MED ORDER — SODIUM CHLORIDE 0.9 % IV SOLN: 1.0000 g | INTRAVENOUS | Status: DC

## 2018-10-13 MED ORDER — SODIUM CHLORIDE 0.9 % IV SOLN
INTRAVENOUS | Status: DC
  Administered 2018-10-13: 12:00:00 250 mL/h via INTRAVENOUS

## 2018-10-13 MED ORDER — METHOCARBAMOL 500 MG PO TABS: 750.0000 mg | ORAL_TABLET | Freq: Three times a day (TID) | ORAL | Status: DC

## 2018-10-13 MED ORDER — SODIUM CHLORIDE 0.9% FLUSH: 3.0000 mL | Freq: Two times a day (BID) | INTRAVENOUS | Status: DC

## 2018-10-13 MED ORDER — HYDROXYZINE HCL 25 MG PO TABS: 50.0000 mg | ORAL_TABLET | Freq: Every day | ORAL | Status: DC

## 2018-10-13 NOTE — H&P (Addendum)
Patient Demographics:    Ian Morrison, is a 28 y.o. male  MRN: [id number]   DOB - 03-Aug-1990  Admit Date - 10/13/2018  Outpatient Primary MD for the patient is No primary care provider on file.   Assessment & Plan:    Principal Problem:   Toxic metabolic encephalopathy secondary to drug overdose Active Problems:   Drug overdose/amphetamine/cocaine and opiates   Leukocytosis--?? Reactive   Polysubstance abuse/amphetamine/cocaine/opiates     28 y.o. male with history of polysubstance abuse presents to the ED by EMS after being found unresponsive in the hotel room.28 y.o. male with history of polysubstance abuse presents to the ED by EMS after being found unresponsive in the hotel room.   Called to admit the patient---   However patient refused to be admitted and left AGAINST MEDICAL ADVICE   Patient left from the ED AMA    Ian Morrison Nickie Warwick, MD  1)Toxic metabolic Encephalopathy--- due to drug overdose in a patient with polysubstance abuse---- UDS in the ED is positive for marijuana, amphetamine or cocaine, no opiates on the UDS indicating the patient may have been injecting fentanyl rather than heroin--- hydrate, give methocarbamol, gabapentin, phenobarbital taper as well as hydroxyzine to help blunt withdrawal symptoms--- avoid opiates.... May use benzos very judiciously if needed  2)Leukocytosis--- may be related to UTI or possibly due to reactive leukocytosis in the setting of unresponsive episode and CPR--- no evidence of sepsis per se, blood and urine cultures pending  3)Possible UTI--- this would be unusual in a 28 year old male, treat empirically with IV Rocephin pending culture results  4)Elevated LFTs--- patient with history of IVDU, ALT higher than AST, check acute viral otitis profile   5)Elevated  Glucose--- glucosuria noted on UA, blood sugar 356 on admission, may be reactive, check A1c    28 y.o. male with history of polysubstance abuse presents to the ED by EMS after being found unresponsive in the hotel room.28 y.o. male with history of polysubstance abuse presents to the ED by EMS after being found unresponsive in the hotel room.   Called to admit the patient---   However patient refused to be admitted and left AGAINST MEDICAL ADVICE   Patient left from the ED AMA    Ian Morrison Calton Harshfield, MD  With History of - Reviewed by me  History of polysubstance abuse   Chief Complaint  Patient presents with   Drug Overdose      HPI:    Ian Morrison  is a 28 y.o. male with history of polysubstance abuse presents to the ED by EMS after being found unresponsive in the hotel room.  Aparently patient was shooting up iv opiate (??? Fentanyl as UDS did not show opiates)...  --When patient became unresponsive in the hotel room his friends went to the local pharmacy to try to get Narcan--- they were unable to get Narcan so line placement and EMS were contacted--  Prior to law enforcement and EMS  arriving patient's friend started to do CPR on him because they were afraid that they were losing him--  Police/Law-enforcement gave him IM Narcan x 2 doses  without response can x 2 , EMS apparently gave intranasal Narcan without response and then proceeded to give 2 mg of IV Narcan and patient became very responsive crying and talking  --- Patient with Track marks Rt antecubital fossa  UDS in the ED is positive for marijuana, amphetamine or cocaine, no opiates on the UDS indicating the patient may have been injecting fentanyl rather than heroin  --WBCs 6.3 suspect reactive leukocytosis, glucose is 373, AST 53 ALT 75 with anion gap of 16 bicarb of 26  Patient now complains of right eye blurry vision  In ED--- CT head and chest x-ray without acute findings     28 y.o. male with history of  polysubstance abuse presents to the ED by EMS after being found unresponsive in the hotel room.28 y.o. male with history of polysubstance abuse presents to the ED by EMS after being found unresponsive in the hotel room.   Called to admit the patient---   However patient refused to be admitted and left AGAINST MEDICAL ADVICE   Patient left from the ED Patterson, MD   Review of systems:    In addition to the HPI above,   A full Review of  Systems was done, all other systems reviewed are negative except as noted above in HPI , .     28 y.o. male with history of polysubstance abuse presents to the ED by EMS after being found unresponsive in the hotel room.28 y.o. male with history of polysubstance abuse presents to the ED by EMS after being found unresponsive in the hotel room.   Called to admit the patient---   However patient refused to be admitted and left AGAINST MEDICAL ADVICE   Patient left from the ED Shevlin, MD    Social History:  Reviewed by me    Social History   Tobacco Use   Smoking status: Not on file  Substance Use Topics   Alcohol use: Not on file     Family History :  Reviewed by me  H/o HTN   Home Medications:   Prior to Admission medications   Not on File     Allergies:    No Known Allergies   Physical Exam:   Vitals  Blood pressure 129/81, pulse 91, temperature (!) 97.5 F (36.4 C), temperature source Oral, resp. rate 11, SpO2 95 %.  Physical Examination: General appearance - alert, well appearing, and in no distress  Mental status - alert, oriented to person, place, and time,  Eyes - sclera anicteric, complains of right eye blurry vision Neck - supple, no JVD elevation , Chest - clear  to auscultation bilaterally, symmetrical air movement,  Heart - S1 and S2 normal, regular  Abdomen - soft, nontender, nondistended, no masses or organomegaly Neurological - screening mental status exam normal, neck  supple without rigidity, cranial nerves II through XII intact, DTR's normal and symmetric--- concerns about extraocular movements of the right eye Extremities - no pedal edema noted, intact peripheral pulses , Rt antecubital fossa with needle track marks Skin - warm, dry   Data Review:    CBC Recent Labs  Lab 10/13/18 1153  WBC 26.3*  HGB 15.0  HCT 46.5  PLT 428*  MCV 87.7  MCH 28.3  MCHC 32.3  RDW  14.4  LYMPHSABS 1.1  MONOABS 0.5  EOSABS 0.0  BASOSABS 0.1   ------------------------------------------------------------------------------------------------------------------  Chemistries  Recent Labs  Lab 10/13/18 1153  NA 140  K 4.5  CL 98  CO2 26  GLUCOSE 376*  BUN 16  CREATININE 1.16  CALCIUM 9.1  MG 2.4  AST 53*  ALT 75*  ALKPHOS 68  BILITOT 0.6   ------------------------------------------------------------------------------------------------------------------ CrCl cannot be calculated (Unknown ideal weight.). ------------------------------------------------------------------------------------------------------------------ No results for input(s): TSH, T4TOTAL, T3FREE, THYROIDAB in the last 72 hours.  Invalid input(s): FREET3   Coagulation profile No results for input(s): INR, PROTIME in the last 168 hours. ------------------------------------------------------------------------------------------------------------------- No results for input(s): DDIMER in the last 72 hours. -------------------------------------------------------------------------------------------------------------------  Cardiac Enzymes No results for input(s): CKMB, TROPONINI, MYOGLOBIN in the last 168 hours.  Invalid input(s): CK ------------------------------------------------------------------------------------------------------------------ No results found for:  BNP  -------------------------------------------------------------------------------------------------------------  Urinalysis    Component Value Date/Time   COLORURINE YELLOW 10/13/2018 1250   APPEARANCEUR HAZY (A) 10/13/2018 1250   LABSPEC 1.016 10/13/2018 1250   PHURINE 6.0 10/13/2018 1250   GLUCOSEU >=500 (A) 10/13/2018 1250   HGBUR NEGATIVE 10/13/2018 1250   BILIRUBINUR NEGATIVE 10/13/2018 1250   KETONESUR NEGATIVE 10/13/2018 1250   PROTEINUR 100 (A) 10/13/2018 1250   NITRITE NEGATIVE 10/13/2018 1250   LEUKOCYTESUR NEGATIVE 10/13/2018 1250    ----------------------------------------------------------------------------------------------------------------   Imaging Results:    Ct Head Wo Contrast  Result Date: 10/13/2018 CLINICAL DATA:  Overdose EXAM: CT HEAD WITHOUT CONTRAST TECHNIQUE: Contiguous axial images were obtained from the base of the skull through the vertex without intravenous contrast. COMPARISON:  None. FINDINGS: Brain: No evidence of acute infarction, hemorrhage, hydrocephalus, extra-axial collection or mass lesion/mass effect. Vascular: No hyperdense vessel or unexpected calcification. Skull: Normal. Negative for fracture or focal lesion. Sinuses/Orbits: No acute finding. Other: None. IMPRESSION: No acute intracranial pathology. Electronically Signed   By: Lauralyn PrimesAlex  Bibbey M.D.   On: 10/13/2018 13:33   Dg Chest Port 1 View  Result Date: 10/13/2018 CLINICAL DATA:  Unresponsive after heroin overdose EXAM: PORTABLE CHEST 1 VIEW COMPARISON:  None. FINDINGS: Lungs are clear. Heart size and pulmonary vascularity are normal. No adenopathy. No evident pneumothorax or pneumomediastinum. No bone lesions. Stomach appears distended with air. IMPRESSION: No edema or consolidation.  Stomach apparently distended with air. Electronically Signed   By: Bretta BangWilliam  Woodruff III M.D.   On: 10/13/2018 14:36    Radiological Exams on Admission: Ct Head Wo Contrast  Result Date:  10/13/2018 CLINICAL DATA:  Overdose EXAM: CT HEAD WITHOUT CONTRAST TECHNIQUE: Contiguous axial images were obtained from the base of the skull through the vertex without intravenous contrast. COMPARISON:  None. FINDINGS: Brain: No evidence of acute infarction, hemorrhage, hydrocephalus, extra-axial collection or mass lesion/mass effect. Vascular: No hyperdense vessel or unexpected calcification. Skull: Normal. Negative for fracture or focal lesion. Sinuses/Orbits: No acute finding. Other: None. IMPRESSION: No acute intracranial pathology. Electronically Signed   By: Lauralyn PrimesAlex  Bibbey M.D.   On: 10/13/2018 13:33   Dg Chest Port 1 View  Result Date: 10/13/2018 CLINICAL DATA:  Unresponsive after heroin overdose EXAM: PORTABLE CHEST 1 VIEW COMPARISON:  None. FINDINGS: Lungs are clear. Heart size and pulmonary vascularity are normal. No adenopathy. No evident pneumothorax or pneumomediastinum. No bone lesions. Stomach appears distended with air. IMPRESSION: No edema or consolidation.  Stomach apparently distended with air. Electronically Signed   By: Bretta BangWilliam  Woodruff III M.D.   On: 10/13/2018 14:36   DVT Prophylaxis -SCD   AM Labs Ordered, also please review Full Orders  Family Communication: Admission, patients condition and plan of care including tests being ordered have been discussed with the patient who indicate understanding and agree with the plan     28 y.o. male with history of polysubstance abuse presents to the ED by EMS after being found unresponsive in the hotel room.28 y.o. male with history of polysubstance abuse presents to the ED by EMS after being found unresponsive in the hotel room.   Called to admit the patient---   However patient refused to be admitted and left AGAINST MEDICAL ADVICE   Patient left from the ED AMA    Ian Morrison Prisilla Kocsis, MD Code Status - Full Code  Likely DC to  TBD  Condition   stable  Ian Morrison Tyechia Allmendinger M.D on 10/13/2018 at 3:28 PM Go to [email or web address] -  for  contact info  [facility] - Office  [phone]

## 2018-10-13 NOTE — ED Notes (Addendum)
Patient expressed desire to leave AMA. I talked with patient and explained danger of leaving against medical advice. Patient still expressed desire to leave AMA.  After explaining dangers of leaving and why he should stay patient left AMA signing out in chart.

## 2018-10-13 NOTE — H&P (Incomplete Revision)
Patient Demographics:    Ian Morrison, is a 28 y.o. male  MRN: [id number]   DOB - 1991/03/03  Admit Date - 10/13/2018  Outpatient Primary MD for the patient is No primary care provider on file.   Assessment & Plan:    Principal Problem:   Toxic metabolic encephalopathy secondary to drug overdose Active Problems:   Drug overdose/amphetamine/cocaine and opiates   Leukocytosis--?? Reactive   Polysubstance abuse/amphetamine/cocaine/opiates    1)Toxic metabolic Encephalopathy--- due to drug overdose in a patient with polysubstance abuse---- UDS in the ED is positive for marijuana, amphetamine or cocaine, no opiates on the UDS indicating the patient may have been injecting fentanyl rather than heroin--- hydrate, give methocarbamol, gabapentin, phenobarbital taper as well as hydroxyzine to help blunt withdrawal symptoms--- avoid opiates.... May use benzos very judiciously if needed  2)Leukocytosis--- may be related to UTI or possibly due to reactive leukocytosis in the setting of unresponsive episode and CPR--- no evidence of sepsis per se, blood and urine cultures pending  3)Possible UTI--- this would be unusual in a 28 year old male, treat empirically with IV Rocephin pending culture results  4)Elevated LFTs--- patient with history of IVDU, ALT higher than AST, check acute viral otitis profile   5)Elevated Glucose--- glucosuria noted on UA, blood sugar 356 on admission, may be reactive, check A1c  With History of - Reviewed by me  History of polysubstance abuse   Chief Complaint  Patient presents with   Drug Overdose      HPI:    Ian SnufferCarl Aman  is a 28 y.o. male with history of polysubstance abuse presents to the ED by EMS after being found unresponsive in the hotel room.  Aparently patient was shooting up  iv opiate (??? Fentanyl as UDS did not show opiates)...  --When patient became unresponsive in the hotel room his friends went to the local pharmacy to try to get Narcan--- they were unable to get Narcan so line placement and EMS were contacted--  Prior to law enforcement and EMS arriving patient's friend started to do CPR on him because they were afraid that they were losing him--  Police/Law-enforcement gave him IM Narcan x 2 doses  without response can x 2 , EMS apparently gave intranasal Narcan without response and then proceeded to give 2 mg of IV Narcan and patient became very responsive crying and talking  --- Patient with Track marks Rt antecubital fossa  UDS in the ED is positive for marijuana, amphetamine or cocaine, no opiates on the UDS indicating the patient may have been injecting fentanyl rather than heroin  --WBCs 6.3 suspect reactive leukocytosis, glucose is 373, AST 53 ALT 75 with anion gap of 16 bicarb of 26  Patient now complains of right eye blurry vision  In ED--- CT head and chest x-ray without acute findings   Review of systems:    In addition to the HPI above,   A full Review of  Systems  was done, all other systems reviewed are negative except as noted above in HPI , .    Social History:  Reviewed by me    Social History   Tobacco Use   Smoking status: Not on file  Substance Use Topics   Alcohol use: Not on file     Family History :  Reviewed by me  H/o HTN   Home Medications:   Prior to Admission medications   Not on File     Allergies:    No Known Allergies   Physical Exam:   Vitals  Blood pressure 129/81, pulse 91, temperature (!) 97.5 F (36.4 C), temperature source Oral, resp. rate 11, SpO2 95 %.  Physical Examination: General appearance - alert, well appearing, and in no distress  Mental status - alert, oriented to person, place, and time,  Eyes - sclera anicteric, complains of right eye blurry vision Neck - supple, no JVD  elevation , Chest - clear  to auscultation bilaterally, symmetrical air movement,  Heart - S1 and S2 normal, regular  Abdomen - soft, nontender, nondistended, no masses or organomegaly Neurological - screening mental status exam normal, neck supple without rigidity, cranial nerves II through XII intact, DTR's normal and symmetric--- concerns about extraocular movements of the right eye Extremities - no pedal edema noted, intact peripheral pulses , Rt antecubital fossa with needle track marks Skin - warm, dry   Data Review:    CBC Recent Labs  Lab 10/13/18 1153  WBC 26.3*  HGB 15.0  HCT 46.5  PLT 428*  MCV 87.7  MCH 28.3  MCHC 32.3  RDW 14.4  LYMPHSABS 1.1  MONOABS 0.5  EOSABS 0.0  BASOSABS 0.1   ------------------------------------------------------------------------------------------------------------------  Chemistries  Recent Labs  Lab 10/13/18 1153  NA 140  K 4.5  CL 98  CO2 26  GLUCOSE 376*  BUN 16  CREATININE 1.16  CALCIUM 9.1  MG 2.4  AST 53*  ALT 75*  ALKPHOS 68  BILITOT 0.6   ------------------------------------------------------------------------------------------------------------------ CrCl cannot be calculated (Unknown ideal weight.). ------------------------------------------------------------------------------------------------------------------ No results for input(s): TSH, T4TOTAL, T3FREE, THYROIDAB in the last 72 hours.  Invalid input(s): FREET3   Coagulation profile No results for input(s): INR, PROTIME in the last 168 hours. ------------------------------------------------------------------------------------------------------------------- No results for input(s): DDIMER in the last 72 hours. -------------------------------------------------------------------------------------------------------------------  Cardiac Enzymes No results for input(s): CKMB, TROPONINI, MYOGLOBIN in the last 168 hours.  Invalid input(s):  CK ------------------------------------------------------------------------------------------------------------------ No results found for: BNP  -------------------------------------------------------------------------------------------------------------  Urinalysis    Component Value Date/Time   COLORURINE YELLOW 10/13/2018 1250   APPEARANCEUR HAZY (A) 10/13/2018 1250   LABSPEC 1.016 10/13/2018 1250   PHURINE 6.0 10/13/2018 1250   GLUCOSEU >=500 (A) 10/13/2018 1250   HGBUR NEGATIVE 10/13/2018 1250   BILIRUBINUR NEGATIVE 10/13/2018 1250   KETONESUR NEGATIVE 10/13/2018 1250   PROTEINUR 100 (A) 10/13/2018 1250   NITRITE NEGATIVE 10/13/2018 1250   LEUKOCYTESUR NEGATIVE 10/13/2018 1250    ----------------------------------------------------------------------------------------------------------------   Imaging Results:    Ct Head Wo Contrast  Result Date: 10/13/2018 CLINICAL DATA:  Overdose EXAM: CT HEAD WITHOUT CONTRAST TECHNIQUE: Contiguous axial images were obtained from the base of the skull through the vertex without intravenous contrast. COMPARISON:  None. FINDINGS: Brain: No evidence of acute infarction, hemorrhage, hydrocephalus, extra-axial collection or mass lesion/mass effect. Vascular: No hyperdense vessel or unexpected calcification. Skull: Normal. Negative for fracture or focal lesion. Sinuses/Orbits: No acute finding. Other: None. IMPRESSION: No acute intracranial pathology. Electronically Signed   By: Eddie Candle  M.D.   On: 10/13/2018 13:33   Dg Chest Port 1 View  Result Date: 10/13/2018 CLINICAL DATA:  Unresponsive after heroin overdose EXAM: PORTABLE CHEST 1 VIEW COMPARISON:  None. FINDINGS: Lungs are clear. Heart size and pulmonary vascularity are normal. No adenopathy. No evident pneumothorax or pneumomediastinum. No bone lesions. Stomach appears distended with air. IMPRESSION: No edema or consolidation.  Stomach apparently distended with air. Electronically Signed    By: Bretta BangWilliam  Woodruff III M.D.   On: 10/13/2018 14:36    Radiological Exams on Admission: Ct Head Wo Contrast  Result Date: 10/13/2018 CLINICAL DATA:  Overdose EXAM: CT HEAD WITHOUT CONTRAST TECHNIQUE: Contiguous axial images were obtained from the base of the skull through the vertex without intravenous contrast. COMPARISON:  None. FINDINGS: Brain: No evidence of acute infarction, hemorrhage, hydrocephalus, extra-axial collection or mass lesion/mass effect. Vascular: No hyperdense vessel or unexpected calcification. Skull: Normal. Negative for fracture or focal lesion. Sinuses/Orbits: No acute finding. Other: None. IMPRESSION: No acute intracranial pathology. Electronically Signed   By: Lauralyn PrimesAlex  Bibbey M.D.   On: 10/13/2018 13:33   Dg Chest Port 1 View  Result Date: 10/13/2018 CLINICAL DATA:  Unresponsive after heroin overdose EXAM: PORTABLE CHEST 1 VIEW COMPARISON:  None. FINDINGS: Lungs are clear. Heart size and pulmonary vascularity are normal. No adenopathy. No evident pneumothorax or pneumomediastinum. No bone lesions. Stomach appears distended with air. IMPRESSION: No edema or consolidation.  Stomach apparently distended with air. Electronically Signed   By: Bretta BangWilliam  Woodruff III M.D.   On: 10/13/2018 14:36   DVT Prophylaxis -SCD   AM Labs Ordered, also please review Full Orders  Family Communication: Admission, patients condition and plan of care including tests being ordered have been discussed with the patient who indicate understanding and agree with the plan   Code Status - Full Code  Likely DC to  TBD  Condition   stable  Shon Haleourage Lupe Handley M.D on 10/13/2018 at 3:28 PM Go to [email or web address] -  for contact info  [facility] - Office  [phone]

## 2018-10-13 NOTE — ED Notes (Signed)
Pt advised a urine is needed, he stated he does not need to go at this time. Call bell and urinal at bedside.

## 2018-10-13 NOTE — ED Notes (Signed)
Pt will not answer questions at this time. Shivering and crying

## 2018-10-13 NOTE — ED Notes (Signed)
Patient

## 2018-10-13 NOTE — ED Triage Notes (Addendum)
Pt in hotel and shot up heroin and went  Unresponsive. Friends called. Found by EMS in floor agonal resp. Adm 4mg  narcan intranasal and the 2 mg IV and became responsive. Pt is awake and crying at this time EDP at bedside. Track marks left ac

## 2018-10-13 NOTE — ED Provider Notes (Signed)
[facility] EMERGENCY DEPARTMENT Provider Note   CSN: [id number] Arrival date & time: 10/13/18  1132    History   Chief Complaint Chief Complaint  Patient presents with  . Drug Overdose    HPI Ian Morrison is a [age] y.o. male.     HPI  The patient is an unknown aged male who presents by EMS after a friend found him in his hotel room, where he lives according to onlookers, he was unresponsive, had agonal breathing, there was report of possible heroin overdose.  Unfortunately the patient is still not answering questions appropriately, level 5 caveat applies secondary to altered mental status.  He was given a total of 6 mg of Narcan prehospital and went from an agonal state to a place where he was breathing spontaneously, moving around but still not answering questions, he is tearful, crying and saying "I do not know" to all questions.  History reviewed. No pertinent past medical history.  Patient Active Problem List   Diagnosis Date Noted  . Drug overdose/amphetamine/cocaine and opiates 10/13/2018  . Polysubstance abuse/amphetamine/cocaine/opiates 10/13/2018  . Leukocytosis--?? Reactive 10/13/2018  . Toxic metabolic encephalopathy secondary to drug overdose 10/13/2018    History reviewed. No pertinent surgical history.      Home Medications    Prior to Admission medications   Not on File    Family History No family history on file.  Social History Social History   Tobacco Use  . Smoking status: Not on file  Substance Use Topics  . Alcohol use: Not on file  . Drug use: Not on file     Allergies   Patient has no known allergies.   Review of Systems Review of Systems  All other systems reviewed and are negative.    Physical Exam Updated Vital Signs BP 129/81   Pulse 91   Temp (!) 97.5 F (36.4 C) (Oral)   Resp 11   SpO2 95%   Physical Exam Vitals signs and nursing note reviewed.  Constitutional:      General: He is in acute distress.   Appearance: He is well-developed. He is diaphoretic.  HENT:     Head: Normocephalic and atraumatic.     Mouth/Throat:     Pharynx: No oropharyngeal exudate.  Eyes:     General: No scleral icterus.       Right eye: No discharge.        Left eye: No discharge.     Conjunctiva/sclera: Conjunctivae normal.     Pupils: Pupils are equal, round, and reactive to light.     Comments: 2+ are symmetrical and enlarged bilaterally  Neck:     Musculoskeletal: Normal range of motion and neck supple.     Thyroid: No thyromegaly.     Vascular: No JVD.  Cardiovascular:     Rate and Rhythm: Regular rhythm. Tachycardia present.     Heart sounds: Normal heart sounds. No murmur. No friction rub. No gallop.   Pulmonary:     Effort: Pulmonary effort is normal. No respiratory distress.     Breath sounds: Normal breath sounds. No wheezing or rales.  Abdominal:     General: Bowel sounds are normal. There is no distension.     Palpations: Abdomen is soft. There is no mass.     Tenderness: There is no abdominal tenderness.  Musculoskeletal: Normal range of motion.        General: No tenderness or signs of injury.  Lymphadenopathy:     Cervical: No cervical  adenopathy.  Skin:    General: Skin is warm.     Findings: No erythema or rash.     Comments: Old track marks are present in the right antecubital fossa  Neurological:     Mental Status: He is alert.     Coordination: Coordination normal.     Comments: The patient is moving all 4 extremities, he is shivering diffusely, he continues to state "I do not know" but otherwise is not answering other questions, seems to follow occasional commands but seems confused  Psychiatric:        Behavior: Behavior normal.       ED Treatments / Results  Labs (all labs ordered are listed, but only abnormal results are displayed) Labs Reviewed  COMPREHENSIVE METABOLIC PANEL - Abnormal; Notable for the following components:      Result Value   Glucose, Bld 376 (*)     AST 53 (*)    ALT 75 (*)    Anion gap 16 (*)    All other components within normal limits  CBC WITH DIFFERENTIAL/PLATELET - Abnormal; Notable for the following components:   WBC 26.3 (*)    Platelets 428 (*)    Neutro Abs 24.3 (*)    Abs Immature Granulocytes 0.33 (*)    All other components within normal limits  RAPID URINE DRUG SCREEN, HOSP PERFORMED - Abnormal; Notable for the following components:   Cocaine POSITIVE (*)    Amphetamines POSITIVE (*)    Tetrahydrocannabinol POSITIVE (*)    All other components within normal limits  URINALYSIS, ROUTINE W REFLEX MICROSCOPIC - Abnormal; Notable for the following components:   APPearance HAZY (*)    Glucose, UA >=500 (*)    Protein, ur 100 (*)    Bacteria, UA RARE (*)    All other components within normal limits  CBG MONITORING, ED - Abnormal; Notable for the following components:   Glucose-Capillary 394 (*)    All other components within normal limits  NOVEL CORONAVIRUS, NAA (HOSPITAL ORDER, SEND-OUT TO REF LAB)  CULTURE, BLOOD (ROUTINE X 2)  CULTURE, BLOOD (ROUTINE X 2)  URINE CULTURE  MAGNESIUM  ETHANOL    EKG EKG Interpretation  Date/Time:  Saturday October 13 2018 11:40:15 EDT Ventricular Rate:  111 PR Interval:    QRS Duration: 100 QT Interval:  353 QTC Calculation: 480 R Axis:   103 Text Interpretation:  Sinus tachycardia Biatrial enlargement Consider right ventricular hypertrophy Borderline prolonged QT interval No old tracing to compare Confirmed by Eber HongMiller, Makenze Ellett ([id number]) on 10/13/2018 1:38:55 PM   Radiology Ct Head Wo Contrast  Result Date: 10/13/2018 CLINICAL DATA:  Overdose EXAM: CT HEAD WITHOUT CONTRAST TECHNIQUE: Contiguous axial images were obtained from the base of the skull through the vertex without intravenous contrast. COMPARISON:  None. FINDINGS: Brain: No evidence of acute infarction, hemorrhage, hydrocephalus, extra-axial collection or mass lesion/mass effect. Vascular: No hyperdense vessel or  unexpected calcification. Skull: Normal. Negative for fracture or focal lesion. Sinuses/Orbits: No acute finding. Other: None. IMPRESSION: No acute intracranial pathology. Electronically Signed   By: Lauralyn PrimesAlex  Bibbey M.D.   On: 10/13/2018 13:33   Dg Chest Port 1 View  Result Date: 10/13/2018 CLINICAL DATA:  Unresponsive after heroin overdose EXAM: PORTABLE CHEST 1 VIEW COMPARISON:  None. FINDINGS: Lungs are clear. Heart size and pulmonary vascularity are normal. No adenopathy. No evident pneumothorax or pneumomediastinum. No bone lesions. Stomach appears distended with air. IMPRESSION: No edema or consolidation.  Stomach apparently distended with air. Electronically Signed  By: Lowella Grip III M.D.   On: 10/13/2018 14:36    Procedures Procedures (including critical care time)  Medications Ordered in ED Medications  0.9 %  sodium chloride infusion (250 mL/hr Intravenous New Bag/Given 10/13/18 1200)  PHENobarbital (LUMINAL) tablet 32.4 mg (has no administration in time range)  hydrOXYzine (ATARAX/VISTARIL) tablet 25 mg (has no administration in time range)  hydrOXYzine (ATARAX/VISTARIL) tablet 50 mg (has no administration in time range)  methocarbamol (ROBAXIN) tablet 750 mg (has no administration in time range)  gabapentin (NEURONTIN) tablet 300 mg (has no administration in time range)  0.9 %  sodium chloride infusion (has no administration in time range)  cefTRIAXone (ROCEPHIN) 1 g in sodium chloride 0.9 % 100 mL IVPB (has no administration in time range)  traZODone (DESYREL) tablet 100 mg (has no administration in time range)  pantoprazole (PROTONIX) EC tablet 40 mg (has no administration in time range)     Initial Impression / Assessment and Plan / ED Course  I have reviewed the triage vital signs and the nursing notes.  Pertinent labs & imaging results that were available during my care of the patient were reviewed by me and considered in my medical decision making (see chart for  details).  Clinical Course as of Oct 12 1504  Sat Oct 13, 2018  1151 All history was obtained from both paramedics and the police officers the latter of who arrives several minutes later stating that the patient's friends had been to the drugstore 1 hour prior to the 911 call trying to get Narcan for a friend who overdosed.  Reportedly when they called 911 for assistance they were doing CPR because of possible cardiac arrest, they reported that they got pulses back and by the time the officers were on scene giving Narcan the patient was more responsive but not breathing well and not answering questions.  It was only after paramedics gave more Narcan intravenously that the patient seem to be more arousable and breathing more spontaneously and awake.  The patient cannot recall any of this information or at least he is not owning up to it.  On repeat exam at 11:50 AM the patient continues to be mildly tachycardic, not answering questions appropriately but nonfocal.  He is moving all 4 extremities and has no difficulty with breathing.  Pupils remain dilated   [BM]  0254 Laboratory work-up shows a glucose of 376, anion gap of 16 but normal CO2, alcohol negative, immediate magnesium normal, white blood cell count of 26,000 with significant neutrophil predominance.  Will obtain a portable chest to rule out pneumonia such as aspiration, will need to have likely admission due to significant abnormalities status post CPR and altered state   [BM]    Clinical Course User Index [BM] Noemi Chapel, MD      The patient was found in this agonal state, it is not clear whether he had overdosed on medicines, whether he had a seizure and is postictal or some other cause.  He will need cardiac monitoring and further evaluation as he clears his mental status.  I do not see any signs of significant trauma or any fresh track marks.  Ultimately I discussed the care with the hospitalist who agrees to admit the patient to the  hospital due to his prolonged downtime, gradual neurologic recovery and now right-sided visual deficit.  I also discussed the care with a local neurologist Dr. Merlene Laughter who agrees that the patient can stay at this hospital and have further  work-up as needed inpatient.  Final Clinical Impressions(s) / ED Diagnoses   Final diagnoses:  Acute encephalopathy  Drug overdose, accidental or unintentional, initial encounter    ED Discharge Orders    None       Eber HongMiller, Myan Suit, MD 10/13/18 1506

## 2018-10-13 NOTE — ED Notes (Addendum)
Pt states he doesn't know what he took. Able to tell us his name and DOB

## 2018-10-13 NOTE — ED Notes (Signed)
Patient walked with monitor. Patient able to ambulate and denied shortness of breath, weakness, dizziness.

## 2018-10-14 LAB — NOVEL CORONAVIRUS, NAA (HOSP ORDER, SEND-OUT TO REF LAB; TAT 18-24 HRS): SARS-CoV-2, NAA: NOT DETECTED

## 2018-10-14 NOTE — Progress Notes (Signed)
   28 y.o. male with history of polysubstance abuse presents to the ED by EMS after being found unresponsive in the hotel room.28 y.o. male with history of polysubstance abuse presents to the ED by EMS after being found unresponsive in the hotel room.  Called to admit the patient---  However patient refused to be admitted and left Reserve  Patient left from the ED AMA  Please see full H&P dated 10/13/2018  Roxan Hockey, MD

## 2018-10-15 LAB — COMPREHENSIVE METABOLIC PANEL
ALT: 75 U/L — ABNORMAL HIGH (ref 0–44)
AST: 53 U/L — ABNORMAL HIGH (ref 15–41)
Albumin: 4.2 g/dL (ref 3.5–5.0)
Alkaline Phosphatase: 68 U/L (ref 38–126)
Anion gap: 16 — ABNORMAL HIGH (ref 5–15)
BUN: 16 mg/dL (ref 8–23)
CO2: 26 mmol/L (ref 22–32)
Calcium: 9.1 mg/dL (ref 8.9–10.3)
Chloride: 98 mmol/L (ref 98–111)
Creatinine, Ser: 1.16 mg/dL (ref 0.61–1.24)
GFR calc Af Amer: >60 mL/min (ref >60)
GFR calc non Af Amer: >60 mL/min (ref >60)
Glucose, Bld: 376 mg/dL — ABNORMAL HIGH (ref 70–99)
Potassium: 4.5 mmol/L (ref 3.5–5.1)
Sodium: 140 mmol/L (ref 135–145)
Total Bilirubin: 0.6 mg/dL (ref 0.3–1.2)
Total Protein: 8.1 g/dL (ref 6.5–8.1)

## 2018-10-15 LAB — CULTURE, BLOOD (ROUTINE X 2)
Culture: NO GROWTH
Culture: NO GROWTH
Special Requests: ADEQUATE
Special Requests: ADEQUATE

## 2018-10-15 LAB — URINE CULTURE
Culture: NO GROWTH
Special Requests: NORMAL

## 2018-10-15 LAB — HEPATITIS PANEL, ACUTE
HCV Ab: >11 s/co ratio — ABNORMAL HIGH (ref 0.0–0.9)
Hep A IgM: NEGATIVE
Hep B C IgM: NEGATIVE
Hepatitis B Surface Ag: NEGATIVE

## 2018-10-15 LAB — HIV ANTIBODY (ROUTINE TESTING W REFLEX): HIV Screen 4th Generation wRfx: NONREACTIVE

## 2018-12-05 ENCOUNTER — Encounter (HOSPITAL_COMMUNITY): Payer: Self-pay

## 2018-12-05 ENCOUNTER — Other Ambulatory Visit: Payer: Self-pay

## 2018-12-05 ENCOUNTER — Emergency Department (HOSPITAL_COMMUNITY)
Admission: EM | Admit: 2018-12-05 | Discharge: 2018-12-05 | Payer: Self-pay | Attending: Emergency Medicine | Admitting: Emergency Medicine

## 2018-12-05 DIAGNOSIS — T401X1A Poisoning by heroin, accidental (unintentional), initial encounter: Secondary | ICD-10-CM | POA: Insufficient documentation

## 2018-12-05 DIAGNOSIS — F17210 Nicotine dependence, cigarettes, uncomplicated: Secondary | ICD-10-CM | POA: Insufficient documentation

## 2018-12-05 DIAGNOSIS — F1910 Other psychoactive substance abuse, uncomplicated: Secondary | ICD-10-CM | POA: Insufficient documentation

## 2018-12-05 LAB — ETHANOL: Alcohol, Ethyl (B): <10 mg/dL (ref <10)

## 2018-12-05 LAB — COMPREHENSIVE METABOLIC PANEL
ALT: 111 U/L — ABNORMAL HIGH (ref 0–44)
AST: 74 U/L — ABNORMAL HIGH (ref 15–41)
Albumin: 3.4 g/dL — ABNORMAL LOW (ref 3.5–5.0)
Alkaline Phosphatase: 69 U/L (ref 38–126)
Anion gap: 10 (ref 5–15)
BUN: 9 mg/dL (ref 6–20)
CO2: 26 mmol/L (ref 22–32)
Calcium: 8.7 mg/dL — ABNORMAL LOW (ref 8.9–10.3)
Chloride: 102 mmol/L (ref 98–111)
Creatinine, Ser: 0.89 mg/dL (ref 0.61–1.24)
GFR calc Af Amer: >60 mL/min (ref >60)
GFR calc non Af Amer: >60 mL/min (ref >60)
Glucose, Bld: 171 mg/dL — ABNORMAL HIGH (ref 70–99)
Potassium: 3.7 mmol/L (ref 3.5–5.1)
Sodium: 138 mmol/L (ref 135–145)
Total Bilirubin: 0.7 mg/dL (ref 0.3–1.2)
Total Protein: 7 g/dL (ref 6.5–8.1)

## 2018-12-05 LAB — RAPID URINE DRUG SCREEN, HOSP PERFORMED
Amphetamines: POSITIVE — AB
Barbiturates: NOT DETECTED
Benzodiazepines: NOT DETECTED
Cocaine: NOT DETECTED
Opiates: POSITIVE — AB
Tetrahydrocannabinol: NOT DETECTED

## 2018-12-05 LAB — CBC WITH DIFFERENTIAL/PLATELET
Abs Immature Granulocytes: 0.04 10*3/uL (ref 0.00–0.07)
Basophils Absolute: 0 10*3/uL (ref 0.0–0.1)
Basophils Relative: 0 %
Eosinophils Absolute: 0.1 10*3/uL (ref 0.0–0.5)
Eosinophils Relative: 1 %
HCT: 42.8 % (ref 39.0–52.0)
Hemoglobin: 13.9 g/dL (ref 13.0–17.0)
Immature Granulocytes: 0 %
Lymphocytes Relative: 13 %
Lymphs Abs: 1.3 10*3/uL (ref 0.7–4.0)
MCH: 27.7 pg (ref 26.0–34.0)
MCHC: 32.5 g/dL (ref 30.0–36.0)
MCV: 85.3 fL (ref 80.0–100.0)
Monocytes Absolute: 0.6 10*3/uL (ref 0.1–1.0)
Monocytes Relative: 6 %
Neutro Abs: 7.6 10*3/uL (ref 1.7–7.7)
Neutrophils Relative %: 80 %
Platelets: 326 10*3/uL (ref 150–400)
RBC: 5.02 MIL/uL (ref 4.22–5.81)
RDW: 13.9 % (ref 11.5–15.5)
WBC: 9.7 10*3/uL (ref 4.0–10.5)
nRBC: 0 % (ref 0.0–0.2)

## 2018-12-05 LAB — CBG MONITORING, ED: Glucose-Capillary: 188 mg/dL — ABNORMAL HIGH (ref 70–99)

## 2018-12-05 MED ORDER — NALOXONE HCL 0.4 MG/ML IJ SOLN
0.4000 mg | Freq: Once | INTRAMUSCULAR | Status: AC
  Administered 2018-12-05: 0.4 mg via INTRAVENOUS
  Filled 2018-12-05: qty 1

## 2018-12-05 MED ORDER — SODIUM CHLORIDE 0.9 % IV SOLN
INTRAVENOUS | Status: DC
  Administered 2018-12-05: 14:00:00 via INTRAVENOUS

## 2018-12-05 MED ORDER — SODIUM CHLORIDE 0.9 % IV BOLUS
1000.0000 mL | Freq: Once | INTRAVENOUS | Status: AC
  Administered 2018-12-05: 1000 mL via INTRAVENOUS

## 2018-12-05 MED ORDER — SODIUM CHLORIDE 0.9 % IV SOLN: INTRAVENOUS | Status: DC

## 2018-12-05 NOTE — ED Notes (Signed)
ED Provider at bedside. 

## 2018-12-05 NOTE — Discharge Instructions (Signed)
Substance Abuse Treatment Programs ° °Intensive Outpatient Programs °[facility]     °[address]      °[place], [place]                   °[phone]      ° °[facility] °[address] °[place], [place] °[phone] ° °[facility]     °(Inpatient and outpatient)     °[address]           °[phone]   ° °[facility] °[phone] (Suboxone and Methadone) ° °[address]      °[place], [place] [address]      °[phone]      ° °[address] °[place], [place] °[phone] ° °[facility] (Outpatient/Inpatient, Chemical)    °(insurance only) [phone]      °       °[facility] (Groups & Residential) °[place], [place] °[phone] ° °   °[facility]     °[address]     °[place], [place]      °[phone]      ° °[facility] (for caregivers and family) °[address] °[place], [place] °336-299-4655 ° ° ° ° ° °Residential Treatment Programs °[facility]      °[address], [place], [place] [address]  °[phone]      ° °[facility] °[address] [place], [place] [address] °[phone] ° °[facility]        °[phone]      ° °[facility] °[phone] ° °[facility]             °[address]                                         °[place], [place]                                                °877-615-2722 or 336-784-9470                              ° °[facility] [place] °[address] °[place], [address] °[phone] ° °[facility]    °[address]      °[place], [place]     °[phone]      ° °[facility] °[address] °[place], [place] °[phone] ° °[facility]   °[address]     °[place], [place] [address]     °[phone]      °Admissions: 8am-3pm M-F ° °[facility] °[address] °[place] °[phone] ° °[facility]: Residential Program (90 Days)   °[place], [place]      °336-725-8389 or 800-758-6077    ° °[facility] °[place], [place] °(Walk in Hours over the weekend or by referral) ° °[facility] °[address], [place], [place] [address] °([phone] ° °Crisis Mobile: [facility]:  1-877-626-1772 (for crisis response 24 hours a day) °[facility] Hotline:      1-800-256-2452 °Outpatient Psychiatry and Counseling ° °[facility]: Mobile Crisis   Management 24 hours:  1-877-626-1772 ° °[facility] sliding scale fee and walk in schedule: M-F 8am-12pm/1pm-3pm °[address]  °[place], [place] [address] °[phone] ° °[facility] °[address] °[place], [place] [address] °[phone] ° °[facility] ([facility])- new patient walk-in appointments available Monday - Friday 8am -3pm.          °[address] °[place], [place] [address] °336-676-6840 or crisis line- 336-676-6905 ° °[facility] Intensive Outpatient Therapy Program °[address] °[place], [place] [address] °336-832-9804 ° °[facility]                  °Crisis Services      °[phone]      °[address]     °[place], [place] [address]                ° °[facility]   °[facility] °[phone] °[address] °[place], [place] [address] ° ° °[facility]          °[address],  °[place], [place] [address]       °([phone] ° °[facility] °[address] °[place], [place] [address] °336-292-1510 ° °[facility]    °[address]    °[place], [place] [address]     °[phone]      ° °Parish McKinney, MD     °[address]     °[place] [address]     °[phone]     °  °[facility] °[address] °[place] [address] ° °[facility]     °[address]     °[place], [place]      °336-542-2076      ° °[facility] °Shamsher Ahluwalia, MD °[address] °[place], [place] [address] °[phone] ° °[facility]     °[address]     °[place], [place] [address]     °336-274-1237      ° °[facility] °[address] °[place], [place] [address] °336-854-4450 °Resources for Temporary Residential Assistance/Crisis Centers ° °DAY CENTERS °[facility] °M-F 8am-3pm   °[address] [place], [place] [address]   336-332-0824 °Services include: laundry, barbering, support groups, case management, phone  & computer access, showers, AA/NA mtgs, mental health/substance abuse nurse, job skills class, disability information, VA assistance, spiritual classes, etc.  ° °HOMELESS SHELTERS ° °[facility]     °[facility]   °[address], [place]     °[phone]       °       °[facility] (women and children)       °[address] °[place], [place] [address] °336-275-0820 °[email or web address] for application and process °Application Required ° °[facility]   °[address]    °[place] [address]     °[phone]       °             °[facility] °[address] °[place], [place] [address] °336.273.5572 °336-235-0363(schedule application appt.) °Application Required ° °[facility] (women only)    °[address]     °[place], [place] [address]     °336-884-1039      °  Intake starts 6pm daily °Need valid ID, SSC, & Police report °[facility] [place] °[address] °[place], [place] °336-881-5420 °Application Required ° °[facility] (men only)     °[address]      °[place], [place]     °[phone]      ° °[facility] °(Pregnant women only) °[address] °[place], [place] °336-275-0206 ° °[facility]      °[address]      °[place], [place] [address]     °[phone]      °       °[facility] °[address] °[place], [place] °336-723-1848 °90 day commitment/SA/Application process ° °[facility](men only)     °[address]     °[place], [place]     °[phone]       °Check-in at 7pm     °       °[facility] °[address] °[place], [place] [address] °336-248-6684 °Men/Women/Women and Children must be there by 7 pm ° °[facility] °[place], [place] °336-722-8721                ° °

## 2018-12-05 NOTE — ED Triage Notes (Addendum)
Patient injected Heroin at [facility]. Was kicked out by hotel owner. Pt ran from police and sustained multiple abrasions in doing so including left knee and elbow. Pt is responsive to voice at this time. Patient admits to using IV heroin and meth daily.

## 2018-12-05 NOTE — ED Notes (Signed)
Pt ambulated throughout hallway. VSS. Denies any complaints. MD notified

## 2018-12-05 NOTE — ED Provider Notes (Signed)
Blood pressure 131/79, pulse 94, temperature 98.3 F (36.8 C), temperature source Oral, resp. rate 16, weight 81.6 kg, SpO2 99 %.  Assuming care from Dr. Venita Sheffield.  In short, Ian Morrison is a 28 y.o. male with a chief complaint of Drug Overdose .  Refer to the original H&P for additional details.  The current plan of care is to follow up with patient after narcan.  04:05 PM  Reassessed patient.  He is more awake.  He has been drinking fluids.  His vital signs have remained normal.  He has not required any additional Narcan.  Heart rate is trending back to normal.  Plan for ambulation here.  If doing well, he will be discharged into police custody.   Patient ambulated well here in the emergency department.  No desaturation.  Remains awake and alert.  Will be discharged into police custody.    Margette Fast, MD 12/05/18 [phone]

## 2018-12-05 NOTE — ED Provider Notes (Signed)
[facility] EMERGENCY DEPARTMENT Provider Note   CSN: [id number] Arrival date & time: 12/05/18  1220     History   Chief Complaint Chief Complaint  Patient presents with  . Drug Overdose    HPI Ian Morrison is a 28 y.o. male.     Patient brought in by police.  Patient was kicked out of a hotel for using heroin.  Also admitted to using meth.  He tried to run from the police.  He ran [place].  He had abrasions to his left knee shin and and abrasions and scratches to the top of his feet.  Patient very drowsy upon arrival.  Speech slurred.     Past Medical History:  Diagnosis Date  . Gout     There are no active problems to display for this patient.   Past Surgical History:  Procedure Laterality Date  . TOE SURGERY          Home Medications    Prior to Admission medications   Medication Sig Start Date End Date Taking? Authorizing Provider  doxycycline (VIBRAMYCIN) 100 MG capsule Take 1 capsule (100 mg total) by mouth 2 (two) times daily. 05/17/18   Volanda Napoleon, PA-C  HYDROcodone-acetaminophen (NORCO/VICODIN) 5-325 MG tablet Take 1 tablet by mouth every 4 (four) hours as needed (for pain). 04/11/18   Molpus, Jenny Reichmann, MD    Family History History reviewed. No pertinent family history.  Social History Social History   Tobacco Use  . Smoking status: Current Every Day Smoker    Types: Cigarettes  . Smokeless tobacco: Never Used  Substance Use Topics  . Alcohol use: No  . Drug use: Yes    Types: IV, Methamphetamines    Comment: heroin     Allergies   Patient has no known allergies.   Review of Systems Review of Systems  Unable to perform ROS: Mental status change     Physical Exam Updated Vital Signs BP 131/79   Pulse 94   Temp 98.3 F (36.8 C) (Oral)   Resp 16   Wt 81.6 kg   SpO2 99%   BMI 24.41 kg/m   Physical Exam Vitals signs and nursing note reviewed.  Constitutional:      Appearance: Normal appearance. He is  well-developed.  HENT:     Head: Normocephalic and atraumatic.  Eyes:     Extraocular Movements: Extraocular movements intact.     Conjunctiva/sclera: Conjunctivae normal.     Pupils: Pupils are equal, round, and reactive to light.  Neck:     Musculoskeletal: Normal range of motion and neck supple.  Cardiovascular:     Rate and Rhythm: Normal rate and regular rhythm.     Heart sounds: No murmur.  Pulmonary:     Effort: Pulmonary effort is normal. No respiratory distress.     Breath sounds: Normal breath sounds.  Abdominal:     Palpations: Abdomen is soft.     Tenderness: There is no abdominal tenderness.  Musculoskeletal: Normal range of motion.     Comments: Patient with abrasions to left knee and shin area.  Measuring about 3 cm.  Not deep.  Also with superficial scratches on top of his feet.  Skin:    General: Skin is warm and dry.  Neurological:     Mental Status: He is alert.     Comments: Patient very sleepy and slurring speech.      ED Treatments / Results  Labs (all labs ordered are listed, but  only abnormal results are displayed) Labs Reviewed  COMPREHENSIVE METABOLIC PANEL - Abnormal; Notable for the following components:      Result Value   Glucose, Bld 171 (*)    Calcium 8.7 (*)    Albumin 3.4 (*)    AST 74 (*)    ALT 111 (*)    All other components within normal limits  RAPID URINE DRUG SCREEN, HOSP PERFORMED - Abnormal; Notable for the following components:   Opiates POSITIVE (*)    Amphetamines POSITIVE (*)    All other components within normal limits  CBG MONITORING, ED - Abnormal; Notable for the following components:   Glucose-Capillary 188 (*)    All other components within normal limits  CBC WITH DIFFERENTIAL/PLATELET  ETHANOL    EKG None  Radiology No results found.  Procedures Procedures (including critical care time)  CRITICAL CARE Performed by: Vanetta MuldersScott Nyimah Shadduck Total critical care time: 30 minutes Critical care time was exclusive  of separately billable procedures and treating other patients. Critical care was necessary to treat or prevent imminent or life-threatening deterioration. Critical care was time spent personally by me on the following activities: development of treatment plan with patient and/or surrogate as well as nursing, discussions with consultants, evaluation of patient's response to treatment, examination of patient, obtaining history from patient or surrogate, ordering and performing treatments and interventions, ordering and review of laboratory studies, ordering and review of radiographic studies, pulse oximetry and re-evaluation of patient's condition.   Medications Ordered in ED Medications  0.9 %  sodium chloride infusion ( Intravenous New Bag/Given 12/05/18 1344)  sodium chloride 0.9 % bolus 1,000 mL (0 mLs Intravenous Stopped 12/05/18 1344)  naloxone (NARCAN) injection 0.4 mg (0.4 mg Intravenous Given 12/05/18 1252)     Initial Impression / Assessment and Plan / ED Course  I have reviewed the triage vital signs and the nursing notes.  Pertinent labs & imaging results that were available during my care of the patient were reviewed by me and considered in my medical decision making (see chart for details).        Patient brought in by police.  Admitted to doing meth and heroin.  Patient was very lethargic upon arrival received Narcan and woke up.  Urine drug screen consistent with what drugs he stated he had taken.  This was not intentional.  He was not trying to harm himself.  Patient will be observed following the Narcan.  Following the Narcan patient became very alert.  Normal neuro exam.   If patient does not become lethargic again or somnolent again.  He can be released into the custody of the police. Final Clinical Impressions(s) / ED Diagnoses   Final diagnoses:  Accidental overdose of heroin, initial encounter [facility](HCC)  Polysubstance abuse [facility](HCC)    ED Discharge Orders    None        Vanetta MuldersZackowski, Monterio Bob, MD 12/05/18 1616

## 2020-05-01 IMAGING — CT CT HEAD WITHOUT CONTRAST
3 series · 16 of 47 positions shown, 19 images · non-contrast
Comparison: None.

CLINICAL DATA: Overdose

EXAM:
CT HEAD WITHOUT CONTRAST
TECHNIQUE: Contiguous axial images were obtained from the base of the skull
through the vertex without intravenous contrast.

[Series 2: head w o · axial · 0.50mm/px · z∈[+62,+192]mm · 10 of 32 slices shown, 13 images]
[im 3/32  brain]
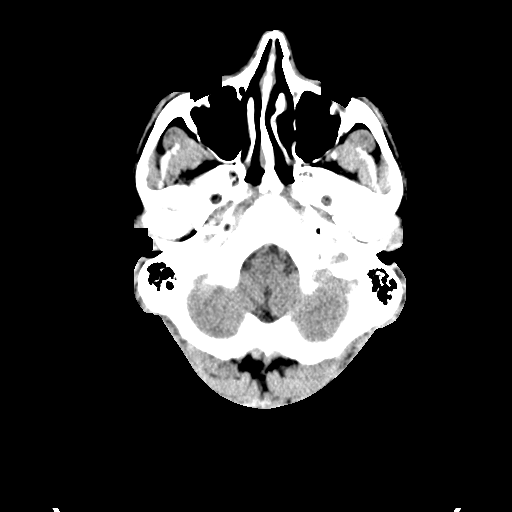
[im 3/32  bone]
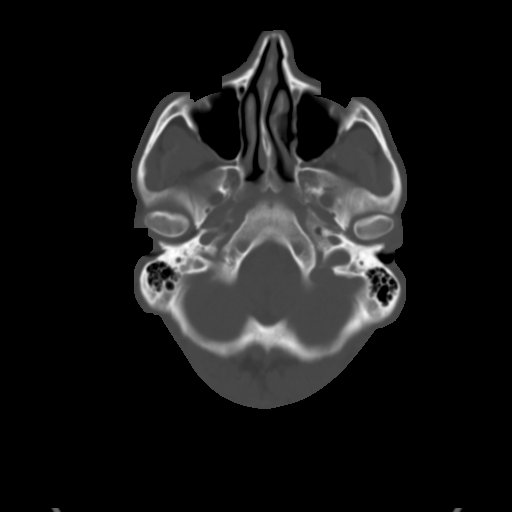
[im 6/32  brain]
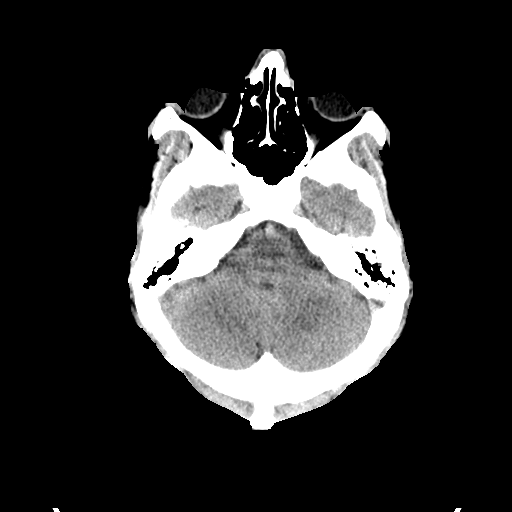
[im 9/32  brain]
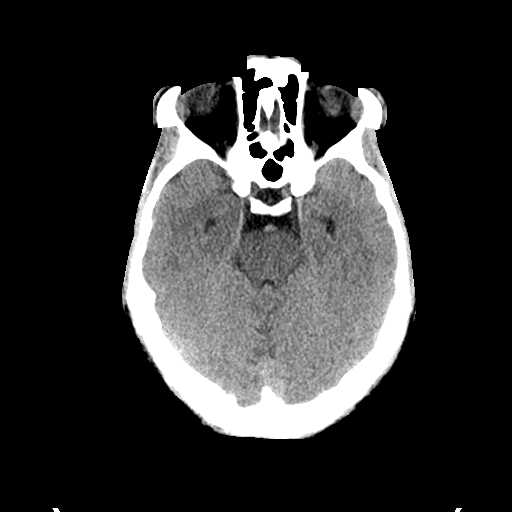
[im 11/32  brain]
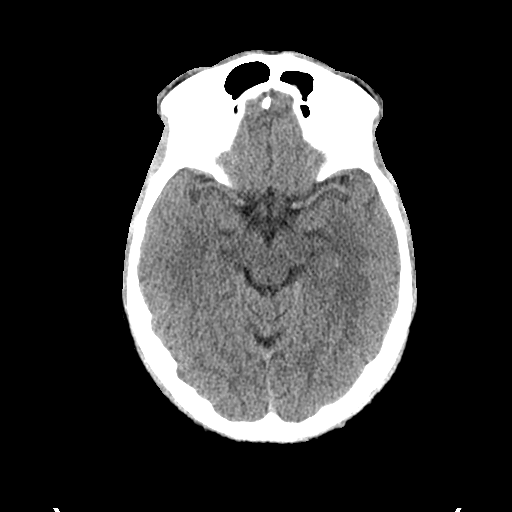
[im 14/32  brain]
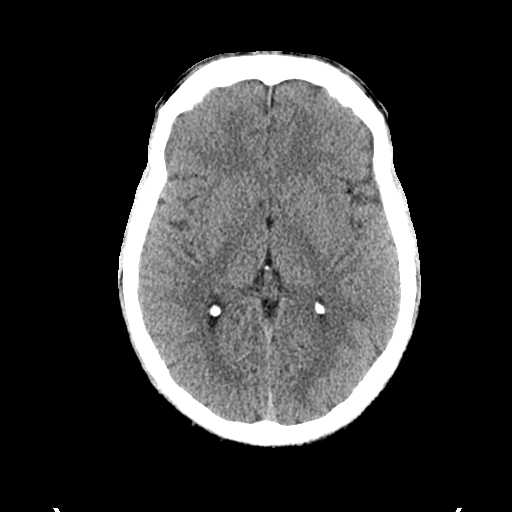
[im 14/32  bone]
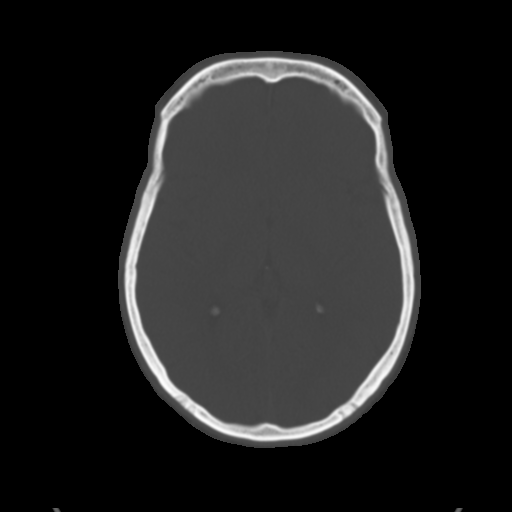
[im 18/32  brain]
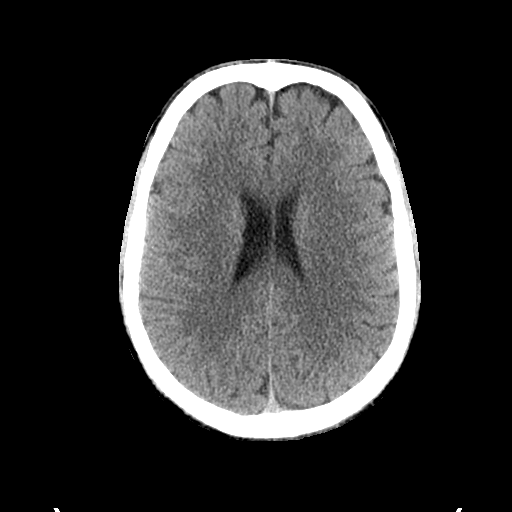
[im 21/32  brain]
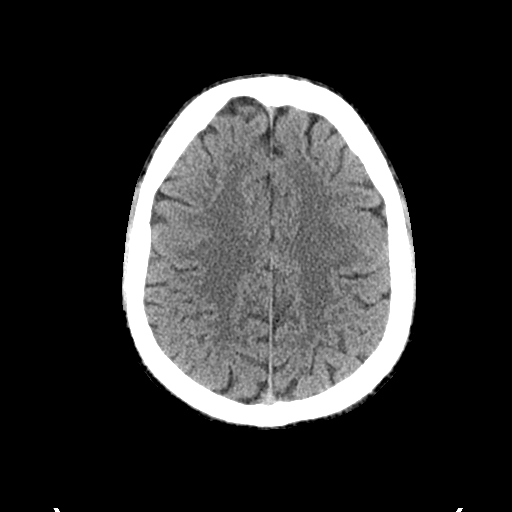
[im 24/32  brain]
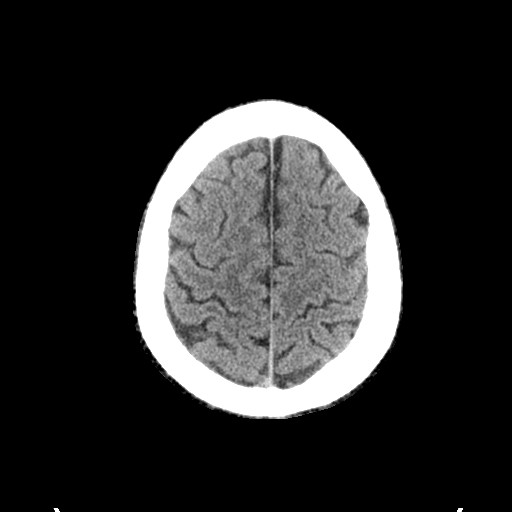
[im 26/32  brain]
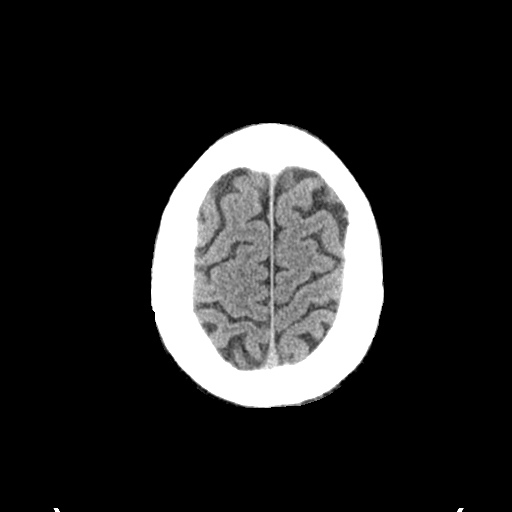
[im 26/32  bone]
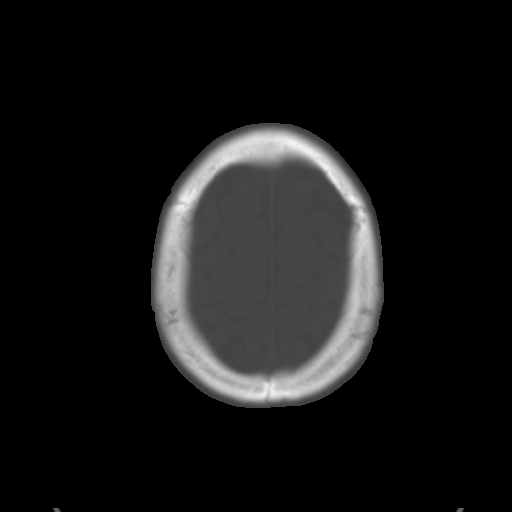
[im 29/32  brain]
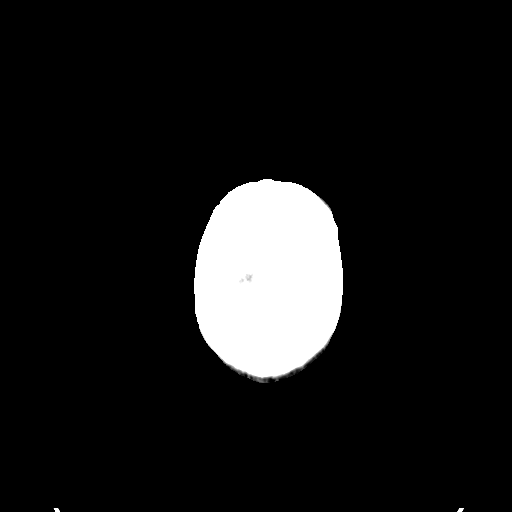

[Series 4: coronal soft · coronal · 0.37mm/px · 3 of 81 slices shown]
[im 27/81  brain]
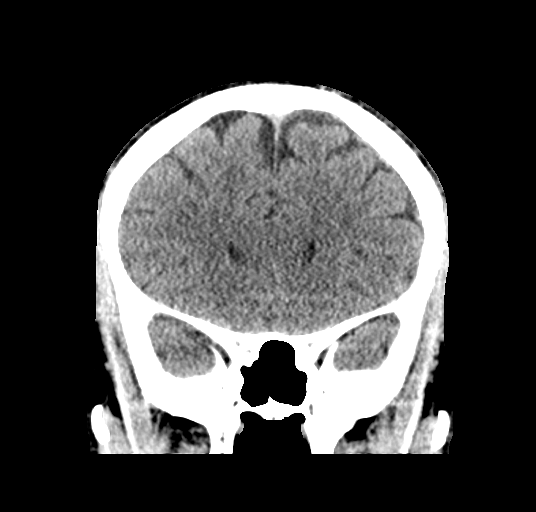
[im 36/81  brain]
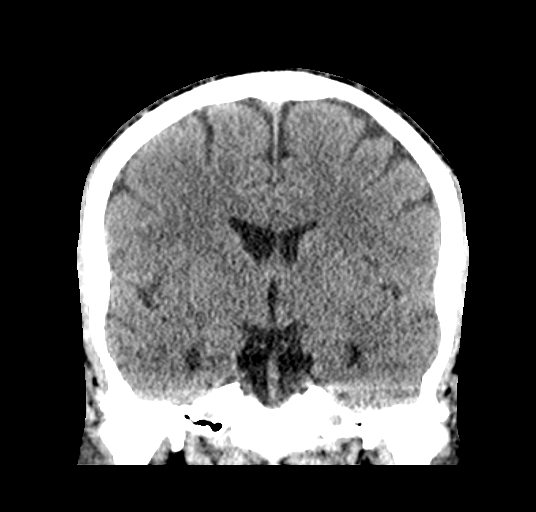
[im 45/81  brain]
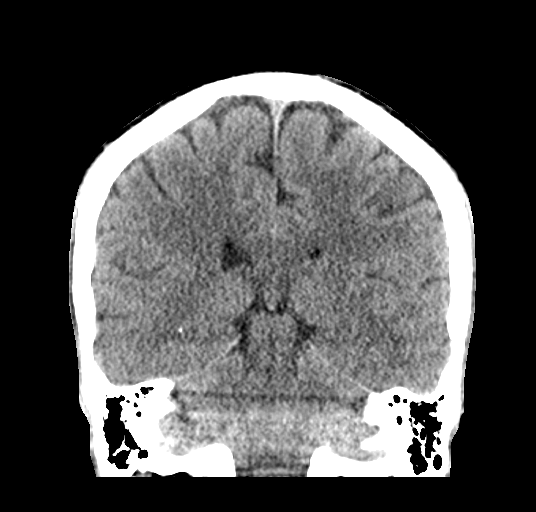

[Series 5: sagittal soft · sagittal · 0.36mm/px · 3 of 67 slices shown]
[im 23/67  brain]
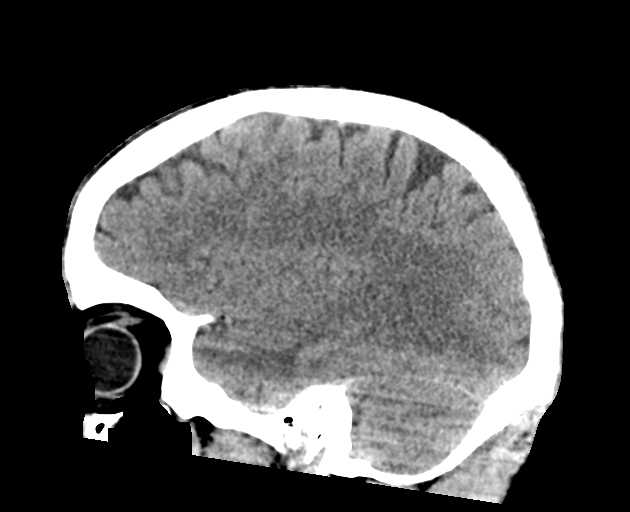
[im 34/67  brain]
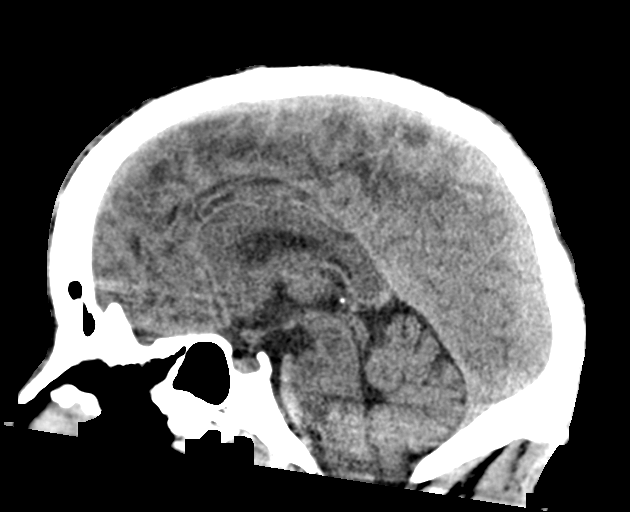
[im 45/67  brain]
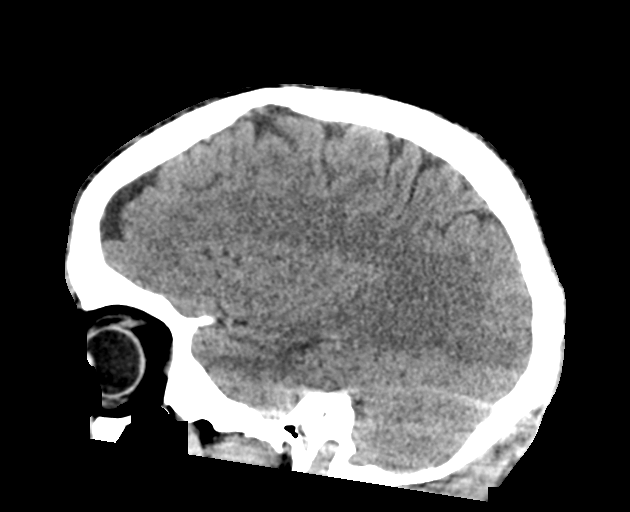

[16 of 47 positions shown; findings below may reference images not displayed]

FINDINGS: Brain: No evidence of acute infarction, hemorrhage, hydrocephalus,
extra-axial collection or mass lesion/mass effect.

Vascular: No hyperdense vessel or unexpected calcification.

Skull: Normal. Negative for fracture or focal lesion.

Sinuses/Orbits: No acute finding.

Other: None.
IMPRESSION: No acute intracranial pathology.
